# Patient Record
Sex: Female | Born: 1935 | Race: Black or African American | Hispanic: No | Smoking: Former smoker
Health system: Southern US, Community
[De-identification: ages and names within clinical notes are randomized; demographics above are authoritative.]

## PROBLEM LIST (undated history)

## (undated) DIAGNOSIS — B351 Tinea unguium: Secondary | ICD-10-CM

## (undated) DIAGNOSIS — J90 Pleural effusion, not elsewhere classified: Secondary | ICD-10-CM

## (undated) DIAGNOSIS — E119 Type 2 diabetes mellitus without complications: Secondary | ICD-10-CM

## (undated) DIAGNOSIS — J181 Lobar pneumonia, unspecified organism: Secondary | ICD-10-CM

## (undated) DIAGNOSIS — J9621 Acute and chronic respiratory failure with hypoxia: Secondary | ICD-10-CM

## (undated) DIAGNOSIS — I471 Supraventricular tachycardia, unspecified: Secondary | ICD-10-CM

## (undated) DIAGNOSIS — J40 Bronchitis, not specified as acute or chronic: Secondary | ICD-10-CM

## (undated) DIAGNOSIS — M1990 Unspecified osteoarthritis, unspecified site: Secondary | ICD-10-CM

## (undated) DIAGNOSIS — R911 Solitary pulmonary nodule: Secondary | ICD-10-CM

## (undated) DIAGNOSIS — Z853 Personal history of malignant neoplasm of breast: Secondary | ICD-10-CM

## (undated) DIAGNOSIS — I5023 Acute on chronic systolic (congestive) heart failure: Secondary | ICD-10-CM

## (undated) DIAGNOSIS — I10 Essential (primary) hypertension: Secondary | ICD-10-CM

## (undated) DIAGNOSIS — E785 Hyperlipidemia, unspecified: Secondary | ICD-10-CM

## (undated) DIAGNOSIS — J309 Allergic rhinitis, unspecified: Secondary | ICD-10-CM

## (undated) DIAGNOSIS — I340 Nonrheumatic mitral (valve) insufficiency: Secondary | ICD-10-CM

## (undated) HISTORY — PX: TOTAL ABDOMINAL HYSTERECTOMY: SHX209

## (undated) HISTORY — DX: Solitary pulmonary nodule: R91.1

## (undated) HISTORY — PX: BREAST CYST EXCISION: SHX579

## (undated) HISTORY — PX: BREAST SURGERY: SHX581

## (undated) HISTORY — DX: Nonrheumatic mitral (valve) insufficiency: I34.0

## (undated) HISTORY — DX: Hyperlipidemia, unspecified: E78.5

## (undated) HISTORY — DX: Personal history of malignant neoplasm of breast: Z85.3

## (undated) HISTORY — PX: INCISIONAL BREAST BIOPSY: SHX1812

## (undated) HISTORY — DX: Allergic rhinitis, unspecified: J30.9

## (undated) HISTORY — DX: Essential (primary) hypertension: I10

## (undated) HISTORY — DX: Type 2 diabetes mellitus without complications: E11.9

## (undated) HISTORY — DX: Unspecified osteoarthritis, unspecified site: M19.90

## (undated) HISTORY — DX: Bronchitis, not specified as acute or chronic: J40

## (undated) HISTORY — DX: Tinea unguium: B35.1

---

## 2004-10-25 ENCOUNTER — Ambulatory Visit: Payer: Self-pay | Admitting: Family Medicine

## 2004-12-15 ENCOUNTER — Ambulatory Visit: Payer: Self-pay | Admitting: Family Medicine

## 2005-01-30 ENCOUNTER — Ambulatory Visit: Payer: Self-pay | Admitting: Family Medicine

## 2005-04-26 ENCOUNTER — Ambulatory Visit: Payer: Self-pay | Admitting: Family Medicine

## 2005-06-08 ENCOUNTER — Ambulatory Visit: Payer: Self-pay | Admitting: Family Medicine

## 2005-10-31 ENCOUNTER — Ambulatory Visit: Payer: Self-pay | Admitting: Family Medicine

## 2005-12-19 ENCOUNTER — Ambulatory Visit: Payer: Self-pay | Admitting: Oncology

## 2005-12-20 ENCOUNTER — Ambulatory Visit: Payer: Self-pay | Admitting: Family Medicine

## 2005-12-25 ENCOUNTER — Ambulatory Visit: Admission: RE | Admit: 2005-12-25 | Discharge: 2006-02-28 | Payer: Self-pay | Admitting: *Deleted

## 2006-02-27 ENCOUNTER — Ambulatory Visit: Payer: Self-pay | Admitting: Oncology

## 2006-06-19 ENCOUNTER — Ambulatory Visit: Payer: Self-pay | Admitting: Oncology

## 2006-10-09 ENCOUNTER — Ambulatory Visit: Payer: Self-pay | Admitting: Oncology

## 2007-01-29 ENCOUNTER — Ambulatory Visit: Payer: Self-pay | Admitting: Oncology

## 2007-05-28 ENCOUNTER — Ambulatory Visit: Payer: Self-pay | Admitting: Oncology

## 2014-11-11 ENCOUNTER — Telehealth: Payer: Self-pay | Admitting: Critical Care Medicine

## 2014-11-11 NOTE — Telephone Encounter (Signed)
Will send to Dr Joya Gaskins per [place] is needing new patient consult in [place] for eval of pulm nodules per Dr Garlon Hatchet. Does not want to travel to [facility] or [place], states that she is older and does not want to drive further than she has to. Referral paperwork has been obtained by front staff and has been placed up front to be given to Dr Joya Gaskins. Please advise Dr Joya Gaskins on scheduling appt. Thanks.

## 2014-11-11 NOTE — Telephone Encounter (Signed)
Will see if pt can come in on Dec 15 at 3:30 for a 4 pm appt in [employer].

## 2014-11-12 NOTE — Telephone Encounter (Signed)
Called, spoke with pt -  Consult scheduled with Dr. Joya Gaskins in [place] office on Dec 15 at 4 pm with pt arriving at 3:30 pm.  Pt aware of office location and will bring all meds with her to appt.  She verbalized understanding and voiced no further questions or concerns at this time.

## 2014-11-16 ENCOUNTER — Encounter: Payer: Self-pay | Admitting: Critical Care Medicine

## 2014-11-17 ENCOUNTER — Encounter: Payer: Self-pay | Admitting: Critical Care Medicine

## 2014-11-17 ENCOUNTER — Ambulatory Visit (INDEPENDENT_AMBULATORY_CARE_PROVIDER_SITE_OTHER): Payer: Self-pay | Admitting: Critical Care Medicine

## 2014-11-17 VITALS — BP 114/56 | HR 75 | Temp 98.4°F | Ht 67.0 in | Wt 169.9 lb

## 2014-11-17 DIAGNOSIS — C3490 Malignant neoplasm of unspecified part of unspecified bronchus or lung: Secondary | ICD-10-CM | POA: Insufficient documentation

## 2014-11-17 DIAGNOSIS — J302 Other seasonal allergic rhinitis: Secondary | ICD-10-CM

## 2014-11-17 DIAGNOSIS — R918 Other nonspecific abnormal finding of lung field: Secondary | ICD-10-CM

## 2014-11-17 DIAGNOSIS — Z853 Personal history of malignant neoplasm of breast: Secondary | ICD-10-CM

## 2014-11-17 DIAGNOSIS — J40 Bronchitis, not specified as acute or chronic: Secondary | ICD-10-CM

## 2014-11-17 DIAGNOSIS — J309 Allergic rhinitis, unspecified: Secondary | ICD-10-CM | POA: Insufficient documentation

## 2014-11-17 DIAGNOSIS — I10 Essential (primary) hypertension: Secondary | ICD-10-CM | POA: Insufficient documentation

## 2014-11-17 DIAGNOSIS — J984 Other disorders of lung: Secondary | ICD-10-CM

## 2014-11-17 DIAGNOSIS — I340 Nonrheumatic mitral (valve) insufficiency: Secondary | ICD-10-CM | POA: Insufficient documentation

## 2014-11-17 DIAGNOSIS — E119 Type 2 diabetes mellitus without complications: Secondary | ICD-10-CM | POA: Insufficient documentation

## 2014-11-17 NOTE — Patient Instructions (Signed)
You need a bronchoscopy and later a PET scan.  Think this over and get back to [place] with your decision. It is ok to wait until January to proceed

## 2014-11-17 NOTE — Progress Notes (Addendum)
Subjective:    Patient ID: Judy Roberts, female    DOB: 01-15-1929, 80 y.o.   MRN: [id number]  HPI Comments: Referral for Left lower lobe lung mass.  Pt notes a chronic cough, hx of bronchitis 09/2014 and started.  Rx ABX, then mid 10/2014 noted more wheezing, more ABX Rx, Cxr done abn>>then CT Chest performed and mass in LLL.  Hx of abn on CXR.  Breast Ca with R breast lumpectomy, 2006.  LNs neg. XRT postop x 6 weeks.  Mammography has been unremarkable.   Cough This is a new problem. The current episode started more than 1 month ago. The problem has been unchanged. The problem occurs every few hours. The cough is productive of sputum. Associated symptoms include wheezing. Pertinent negatives include no chest pain, ear congestion, ear pain, fever, headaches, heartburn, hemoptysis, nasal congestion, postnasal drip, sore throat, shortness of breath or sweats. Nothing aggravates the symptoms. Treatments tried: abx helped  Her past medical history is significant for bronchitis. There is no history of asthma, bronchiectasis, COPD, emphysema, environmental allergies or pneumonia.    Past Medical History  Diagnosis Date  . Allergic rhinitis   . Benign essential hypertension   . Complicated bronchitis   . Diabetes mellitus with no complication   . Mitral regurgitation   . Onychomycosis   . Pulmonary nodule   . History of breast cancer      Family History  Problem Relation Age of Onset  . Pancreatic cancer Sister      History   Social History  . Marital Status: Married    Spouse Name: N/A    Number of Children: N/A  . Years of Education: N/A   Occupational History  . 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 Smoking status: Former Smoker -- 0.30 packs/day for 15 years    Types: Cigarettes    Quit date: 12/04/2004  . Smokeless tobacco: Never Used  . Alcohol Use: No  . Drug Use: No  . Sexual Activity: Not on file   Other Topics Concern  . Not on file     Social History Narrative     Allergies  Allergen Reactions  . Asa [Aspirin]     Gi upset  . Atorvastatin     GI upset  . Levofloxacin     dizziness     Outpatient Prescriptions Prior to Visit  Medication Sig Dispense Refill  . aspirin 81 MG tablet Take 81 mg by mouth daily.    Marland Kitchen ezetimibe (ZETIA) 10 MG tablet Take 10 mg by mouth daily.    . fluticasone (FLONASE) 50 MCG/ACT nasal spray Place 2 sprays into both nostrils daily.    Marland Kitchen lisinopril-hydrochlorothiazide (PRINZIDE,ZESTORETIC) 10-12.5 MG per tablet Take 1 tablet by mouth daily.    Marland Kitchen loratadine (CLARITIN) 10 MG tablet Take 10 mg by mouth daily.     No facility-administered medications prior to visit.      Review of Systems  Constitutional: Negative for fever, fatigue and unexpected weight change.  HENT: Negative for ear pain, postnasal drip, sore throat and trouble swallowing.   Respiratory: Positive for cough and wheezing. Negative for hemoptysis, choking, chest tightness and shortness of breath.   Cardiovascular: Negative for chest pain.  Gastrointestinal: Negative.  Negative for heartburn.  Allergic/Immunologic: Negative for environmental allergies.  Neurological: Negative for headaches.       Objective:   Physical Exam Filed Vitals:   11/17/14 1636  BP:  114/56  Pulse: 75  Temp: 98.4 F (36.9 C)  TempSrc: Oral  Height: 5\' 7"  (1.702 m)  Weight: 169 lb 14.4 oz (77.066 kg)  SpO2: 98%    Gen: Pleasant, well-nourished, in no distress,  normal affect  ENT: No lesions,  mouth clear,  oropharynx clear, no postnasal drip  Neck: No JVD, no TMG, no carotid bruits  Lungs: No use of accessory muscles, no dullness to percussion, clear without rales or rhonchi  Cardiovascular: RRR, heart sounds normal, no murmur or gallops, no peripheral edema  Abdomen: soft and NT, no HSM,  BS normal  Musculoskeletal: No deformities, no cyanosis or clubbing  Neuro: alert, non focal  Skin: Warm, no lesions or  rashes  No results found.        Assessment & Plan:   Mass of lower lobe of left lung Ill-defined left mid lung zone 3.3 x 2.4 cm lung mass.  Note a smaller ill-defined lesion was seen in the left midlung zone in 2011 on previous CT scan of chest however no follow-up scan had been obtained except for chest x-rays which did not reveal significant change until current CT scan of chest November 2015 was obtained Concern with previous history of breast cancer this could be recurrence or could be a primary lung malignancy And airway appears to leads straight to the mass so I believe bronchoscopy would be the best course of diagnostic with The patient is declining evaluation at this time Plan The patient will give further consideration and get back to this office with regards to her decision on evaluation    Updated Medication List Outpatient Encounter Prescriptions as of 11/17/2014  Medication Sig  . aspirin 81 MG tablet Take 81 mg by mouth daily.  Marland Kitchen ezetimibe (ZETIA) 10 MG tablet Take 10 mg by mouth daily.  . fluticasone (FLONASE) 50 MCG/ACT nasal spray Place 2 sprays into both nostrils daily.  Marland Kitchen lisinopril-hydrochlorothiazide (PRINZIDE,ZESTORETIC) 10-12.5 MG per tablet Take 1 tablet by mouth daily.  Marland Kitchen loratadine (CLARITIN) 10 MG tablet Take 10 mg by mouth daily.  . naproxen sodium (ANAPROX) 220 MG tablet Take 220 mg by mouth as needed.

## 2014-11-18 NOTE — Assessment & Plan Note (Addendum)
Ill-defined left mid lung zone 3.3 x 2.4 cm lung mass.  Note a smaller ill-defined lesion was seen in the left midlung zone in 2011 on previous CT scan of chest however no follow-up scan had been obtained except for chest x-rays which did not reveal significant change until current CT scan of chest November 2015 was obtained Concern with previous history of breast cancer this could be recurrence or could be a primary lung malignancy And airway appears to leads straight to the mass so I believe bronchoscopy would be the best course of diagnostic with The patient is declining evaluation at this time Plan The patient will give further consideration and get back to this office with regards to her decision on evaluation

## 2014-11-30 ENCOUNTER — Telehealth: Payer: Self-pay | Admitting: Critical Care Medicine

## 2014-11-30 NOTE — Telephone Encounter (Signed)
Called spoke with patient who reported that she would like to move forward with having a bronch and wonders if Jan 11th or 12th would be possible.   Pt is aware that PW is not in the office until 12/30 and is okay with waiting until he returns to the office  Dr Joya Gaskins please advise, thank you.

## 2014-12-02 NOTE — Telephone Encounter (Signed)
Spoke with Pamala Hurry with Respiratory.  Bronch scheduled for Dec 14, 2014 at 8 am at [facility] with fluoro. Pt to arrive to admitting at 6:45 am; NPO after midnight.  Pt will need driver.   Called, spoke with pt - pt aware of above and voiced no further questions or concerns at this time. Will sign off and route to PW as fyi.

## 2014-12-02 NOTE — Telephone Encounter (Signed)
Please schedule FOB at [facility] for 8AM 12/14/13   Dx lung mass LLL,  Need fluoro.  Let me know when this is done

## 2014-12-03 ENCOUNTER — Other Ambulatory Visit: Payer: Self-pay | Admitting: Critical Care Medicine

## 2014-12-03 DIAGNOSIS — R918 Other nonspecific abnormal finding of lung field: Secondary | ICD-10-CM

## 2014-12-03 NOTE — Telephone Encounter (Signed)
noted 

## 2014-12-14 ENCOUNTER — Encounter (HOSPITAL_COMMUNITY): Payer: Self-pay | Admitting: Radiology

## 2014-12-14 ENCOUNTER — Encounter (HOSPITAL_COMMUNITY)
Admission: RE | Disposition: A | Discharge status: Home / Self Care | Payer: Self-pay | Source: Ambulatory Visit | Attending: Critical Care Medicine

## 2014-12-14 ENCOUNTER — Ambulatory Visit (HOSPITAL_COMMUNITY): Payer: Medicare Other

## 2014-12-14 ENCOUNTER — Ambulatory Visit (HOSPITAL_COMMUNITY)
Admission: RE | Admit: 2014-12-14 | Discharge: 2014-12-14 | Disposition: A | Discharge status: Home / Self Care | Payer: Medicare Other | Source: Ambulatory Visit | Attending: Critical Care Medicine | Admitting: Critical Care Medicine

## 2014-12-14 DIAGNOSIS — I10 Essential (primary) hypertension: Secondary | ICD-10-CM | POA: Insufficient documentation

## 2014-12-14 DIAGNOSIS — Z87891 Personal history of nicotine dependence: Secondary | ICD-10-CM | POA: Insufficient documentation

## 2014-12-14 DIAGNOSIS — J181 Lobar pneumonia, unspecified organism: Secondary | ICD-10-CM

## 2014-12-14 DIAGNOSIS — Z79899 Other long term (current) drug therapy: Secondary | ICD-10-CM | POA: Diagnosis not present

## 2014-12-14 DIAGNOSIS — E119 Type 2 diabetes mellitus without complications: Secondary | ICD-10-CM | POA: Diagnosis not present

## 2014-12-14 DIAGNOSIS — Z7982 Long term (current) use of aspirin: Secondary | ICD-10-CM | POA: Diagnosis not present

## 2014-12-14 DIAGNOSIS — R918 Other nonspecific abnormal finding of lung field: Secondary | ICD-10-CM | POA: Insufficient documentation

## 2014-12-14 DIAGNOSIS — Z853 Personal history of malignant neoplasm of breast: Secondary | ICD-10-CM | POA: Insufficient documentation

## 2014-12-14 DIAGNOSIS — R05 Cough: Secondary | ICD-10-CM | POA: Insufficient documentation

## 2014-12-14 DIAGNOSIS — J189 Pneumonia, unspecified organism: Secondary | ICD-10-CM

## 2014-12-14 HISTORY — PX: VIDEO BRONCHOSCOPY: SHX5072

## 2014-12-14 SURGERY — BRONCHOSCOPY, WITH FLUOROSCOPY
Anesthesia: Moderate Sedation | Laterality: Bilateral

## 2014-12-14 MED ORDER — SODIUM CHLORIDE 0.9 % IV SOLN: Freq: Once | INTRAVENOUS | Status: DC

## 2014-12-14 MED ORDER — PHENYLEPHRINE HCL 0.25 % NA SOLN
1.0000 | Freq: Four times a day (QID) | NASAL | Status: DC | PRN
Start: 2014-12-14 — End: 2014-12-15

## 2014-12-14 MED ORDER — MIDAZOLAM HCL 5 MG/ML IJ SOLN
INTRAMUSCULAR | Status: AC
  Filled 2014-12-14: qty 2

## 2014-12-14 MED ORDER — MIDAZOLAM HCL 10 MG/2ML IJ SOLN
INTRAMUSCULAR | Status: DC | PRN
  Administered 2014-12-14: 3 mg via INTRAVENOUS
  Administered 2014-12-14: 1 mg via INTRAVENOUS

## 2014-12-14 MED ORDER — FENTANYL CITRATE 0.05 MG/ML IJ SOLN
INTRAMUSCULAR | Status: DC | PRN
  Administered 2014-12-14: 30 ug via INTRAVENOUS

## 2014-12-14 MED ORDER — LIDOCAINE HCL 2 % EX GEL
CUTANEOUS | Status: DC | PRN
  Administered 2014-12-14: 1

## 2014-12-14 MED ORDER — PHENYLEPHRINE HCL 0.25 % NA SOLN
NASAL | Status: DC | PRN
  Administered 2014-12-14: 2 via NASAL

## 2014-12-14 MED ORDER — SODIUM CHLORIDE 0.9 % IV SOLN
INTRAVENOUS | Status: DC
  Administered 2014-12-14: 07:00:00 via INTRAVENOUS

## 2014-12-14 MED ORDER — FENTANYL CITRATE 0.05 MG/ML IJ SOLN
INTRAMUSCULAR | Status: AC
  Filled 2014-12-14: qty 4

## 2014-12-14 MED ORDER — LIDOCAINE HCL 1 % IJ SOLN
INTRAMUSCULAR | Status: DC | PRN
  Administered 2014-12-14: 6 mg via RESPIRATORY_TRACT

## 2014-12-14 MED ORDER — BUTAMBEN-TETRACAINE-BENZOCAINE 2-2-14 % EX AERO: 1.0000 | INHALATION_SPRAY | Freq: Once | CUTANEOUS | Status: DC

## 2014-12-14 MED ORDER — LIDOCAINE HCL 2 % EX GEL: Freq: Once | CUTANEOUS | Status: DC

## 2014-12-14 NOTE — Interval H&P Note (Signed)
Pt seen and examined. There have been no interval changes in H and P.  The pt is ready for planned FOB and Bx. Saralyn Pilar WrightMD

## 2014-12-14 NOTE — H&P (View-Only) (Signed)
Subjective:    Patient ID: Judy Roberts, female    DOB: 1929-07-06, 81 y.o.   MRN: [id number]  HPI Comments: Referral for Left lower lobe lung mass.  Pt notes a chronic cough, hx of bronchitis 09/2014 and started.  Rx ABX, then mid 10/2014 noted more wheezing, more ABX Rx, Cxr done abn>>then CT Chest performed and mass in LLL.  Hx of abn on CXR.  Breast Ca with R breast lumpectomy, 2006.  LNs neg. XRT postop x 6 weeks.  Mammography has been unremarkable.   Cough This is a new problem. The current episode started more than 1 month ago. The problem has been unchanged. The problem occurs every few hours. The cough is productive of sputum. Associated symptoms include wheezing. Pertinent negatives include no chest pain, ear congestion, ear pain, fever, headaches, heartburn, hemoptysis, nasal congestion, postnasal drip, sore throat, shortness of breath or sweats. Nothing aggravates the symptoms. Treatments tried: abx helped  Her past medical history is significant for bronchitis. There is no history of asthma, bronchiectasis, COPD, emphysema, environmental allergies or pneumonia.    Past Medical History  Diagnosis Date  . Allergic rhinitis   . Benign essential hypertension   . Complicated bronchitis   . Diabetes mellitus with no complication   . Mitral regurgitation   . Onychomycosis   . Pulmonary nodule   . History of breast cancer      Family History  Problem Relation Age of Onset  . Pancreatic cancer Sister      History   Social History  . Marital Status: Married    Spouse Name: N/A    Number of Children: N/A  . Years of Education: N/A   Occupational History  . 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 Smoking status: Former Smoker -- 0.30 packs/day for 15 years    Types: Cigarettes    Quit date: 12/04/2004  . Smokeless tobacco: Never Used  . Alcohol Use: No  . Drug Use: No  . Sexual Activity: Not on file   Other Topics Concern  . Not on file     Social History Narrative     Allergies  Allergen Reactions  . Asa [Aspirin]     Gi upset  . Atorvastatin     GI upset  . Levofloxacin     dizziness     Outpatient Prescriptions Prior to Visit  Medication Sig Dispense Refill  . aspirin 81 MG tablet Take 81 mg by mouth daily.    Marland Kitchen ezetimibe (ZETIA) 10 MG tablet Take 10 mg by mouth daily.    . fluticasone (FLONASE) 50 MCG/ACT nasal spray Place 2 sprays into both nostrils daily.    Marland Kitchen lisinopril-hydrochlorothiazide (PRINZIDE,ZESTORETIC) 10-12.5 MG per tablet Take 1 tablet by mouth daily.    Marland Kitchen loratadine (CLARITIN) 10 MG tablet Take 10 mg by mouth daily.     No facility-administered medications prior to visit.      Review of Systems  Constitutional: Negative for fever, fatigue and unexpected weight change.  HENT: Negative for ear pain, postnasal drip, sore throat and trouble swallowing.   Respiratory: Positive for cough and wheezing. Negative for hemoptysis, choking, chest tightness and shortness of breath.   Cardiovascular: Negative for chest pain.  Gastrointestinal: Negative.  Negative for heartburn.  Allergic/Immunologic: Negative for environmental allergies.  Neurological: Negative for headaches.       Objective:   Physical Exam Filed Vitals:   11/17/14 1636  BP:  114/56  Pulse: 75  Temp: 98.4 F (36.9 C)  TempSrc: Oral  Height: 5\' 7"  (1.702 m)  Weight: 169 lb 14.4 oz (77.066 kg)  SpO2: 98%    Gen: Pleasant, well-nourished, in no distress,  normal affect  ENT: No lesions,  mouth clear,  oropharynx clear, no postnasal drip  Neck: No JVD, no TMG, no carotid bruits  Lungs: No use of accessory muscles, no dullness to percussion, clear without rales or rhonchi  Cardiovascular: RRR, heart sounds normal, no murmur or gallops, no peripheral edema  Abdomen: soft and NT, no HSM,  BS normal  Musculoskeletal: No deformities, no cyanosis or clubbing  Neuro: alert, non focal  Skin: Warm, no lesions or  rashes  No results found.        Assessment & Plan:   Mass of lower lobe of left lung Ill-defined left mid lung zone 3.3 x 2.4 cm lung mass.  Note a smaller ill-defined lesion was seen in the left midlung zone in 2011 on previous CT scan of chest however no follow-up scan had been obtained except for chest x-rays which did not reveal significant change until current CT scan of chest November 2015 was obtained Concern with previous history of breast cancer this could be recurrence or could be a primary lung malignancy And airway appears to leads straight to the mass so I believe bronchoscopy would be the best course of diagnostic with The patient is declining evaluation at this time Plan The patient will give further consideration and get back to this office with regards to her decision on evaluation    Updated Medication List Outpatient Encounter Prescriptions as of 11/17/2014  Medication Sig  . aspirin 81 MG tablet Take 81 mg by mouth daily.  Marland Kitchen ezetimibe (ZETIA) 10 MG tablet Take 10 mg by mouth daily.  . fluticasone (FLONASE) 50 MCG/ACT nasal spray Place 2 sprays into both nostrils daily.  Marland Kitchen lisinopril-hydrochlorothiazide (PRINZIDE,ZESTORETIC) 10-12.5 MG per tablet Take 1 tablet by mouth daily.  Marland Kitchen loratadine (CLARITIN) 10 MG tablet Take 10 mg by mouth daily.  . naproxen sodium (ANAPROX) 220 MG tablet Take 220 mg by mouth as needed.

## 2014-12-14 NOTE — Discharge Instructions (Signed)
Flexible Bronchoscopy, Care After These instructions give you information on caring for yourself after your procedure. Your doctor may also give you more specific instructions. Call your doctor if you have any problems or questions after your procedure. HOME CARE  Do not eat or drink anything for 2 hours after your procedure. If you try to eat or drink before the medicine wears off, food or drink could go into your lungs. You could also burn yourself.  After 2 hours have passed and when you can cough and gag normally, you may eat soft food and drink liquids slowly.  The day after the test, you may eat your normal diet.  You may do your normal activities.  Keep all doctor visits. GET HELP RIGHT AWAY IF:  You get more and more short of breath.  You get light-headed.  You feel like you are going to pass out (faint).  You have chest pain.  You have new problems that worry you.  You cough up more than a little blood.  You cough up more blood than before. MAKE SURE YOU:  Understand these instructions.  Will watch your condition.  Will get help right away if you are not doing well or get worse. Document Released: 09/17/2009 Document Revised: 11/25/2013 Document Reviewed: 07/25/2013 [facility] Patient Information 2015 [place], [place]. This information is not intended to replace advice given to you by your health care provider. Make sure you discuss any questions you have with your health care provider.

## 2014-12-14 NOTE — Op Note (Signed)
Bronchoscopy Procedure Note  Date of Operation: 12/14/2014  Pre-op Diagnosis: LLL Lung mass   Post-op Diagnosis: same   Surgeon: Asencion Noble  Anesthesia: Monitored Local Anesthesia with Sedation:    Versed 4 mg IVP;  Fentanyl 30 mcg IVP  Operation: Flexible fiberoptic bronchoscopy, diagnostic   Findings: No endobronchial lesions  Specimen: TBBx LLL, Bronchial washings  Estimated Blood Loss: Minimal  Complications: None  Indications and History: The patient is a 81 y.o. female with LLL lung mass, hx of Breast Ca.  The risks, benefits, complications, treatment options and expected outcomes were discussed with the patient.  The possibilities of reaction to medication, pulmonary aspiration, perforation of a viscus, bleeding, failure to diagnose a condition and creating a complication requiring transfusion or operation were discussed with the patient who freely signed the consent.    Description of Procedure: The patient was re-examined in the bronchoscopy suite and the site of surgery properly noted/marked.  The patient was identified as Judy Roberts and the procedure verified as Flexible Fiberoptic Bronchoscopy.  A Time Out was held and the above information confirmed.   After the induction of topical nasopharyngeal anesthesia, the patient was positioned  and the bronchoscope was passed through the R  nares. The vocal cords were visualized and  1% buffered lidocaine 5 ml was topically placed onto the cords. The cords were normal. The scope was then passed into the trachea.  1% buffered lidocaine 5 ml was used topically on the carina.  Careful inspection of the tracheal lumen was accomplished. The scope was sequentially passed into the left main and then left upper and lower bronchi and segmental bronchi.     LLL TBBx X 6 and Bronchial washings  Were  done and there was 2 specimen.  Note needed fluoroscopic control to perform FOB TBBX, NO endobronchial lesions seen   The scope was then  withdrawn and advanced into the right main bronchus and then into the RUL, RML, and RLL bronchi and segmental bronchi.    Endobronchial findings: None Trachea: Normal mucosa Carina: Normal mucosa Right main bronchus: Normal mucosa Right upper lobe bronchus: Normal mucosa Right middle lobe bronchus: Normal mucosa Right lower lobe bronchus: Normal mucosa Left main bronchus: Normal mucosa Left upper lobe bronchus: Normal mucosa Left lower lobe bronchus: Normal mucosa  The Patient was taken to the Endoscopy Recovery area in satisfactory condition.  Attestation: I performed the procedure.  Saralyn Pilar WrightMD 12/14/2014 8:25 AM

## 2014-12-14 NOTE — Progress Notes (Signed)
Video bronchoscopy with biospy intervention, washing intervention, pt. Did well thru out procedure, no complications. Gave report to endo nurse.  I agree with the above note Asencion Noble

## 2014-12-15 ENCOUNTER — Encounter (HOSPITAL_COMMUNITY): Payer: Self-pay | Admitting: Critical Care Medicine

## 2014-12-16 ENCOUNTER — Encounter: Payer: Self-pay | Admitting: Critical Care Medicine

## 2014-12-16 ENCOUNTER — Telehealth: Payer: Self-pay | Admitting: Critical Care Medicine

## 2014-12-16 DIAGNOSIS — C3492 Malignant neoplasm of unspecified part of left bronchus or lung: Secondary | ICD-10-CM

## 2014-12-16 LAB — CULTURE, RESPIRATORY

## 2014-12-16 LAB — CULTURE, RESPIRATORY W GRAM STAIN

## 2014-12-16 NOTE — Telephone Encounter (Signed)
Result Note     Pt aware of results of Lung cancer: squamous cell CA in biopsy.     Pt needs referral to [facility] Dr Hinton Rao or Bobby Rumpf    Also needs PET scan and PFTs at [facility]     ---------  Orders placed - pt aware she will receive a call with appt dates, times, and locations.  She verbalized understanding and voiced no further questions or concerns at this time.  PCCs, will you please ensure PET and PFTs are scheduled at [facility].  Orders placed along with referral to [facility].  Thank you.

## 2014-12-16 NOTE — Progress Notes (Signed)
Quick Note:  Orders placed -- see phone msg from 12/16/14. ______

## 2014-12-17 ENCOUNTER — Telehealth: Payer: Self-pay | Admitting: Critical Care Medicine

## 2014-12-17 NOTE — Telephone Encounter (Signed)
Pet and [email or web address] 12/17/14 and referral has been made to [facility] Joellen Jersey

## 2014-12-17 NOTE — Telephone Encounter (Signed)
Called spoke with pt. She wanted to ensure we did place the referral to [facility]. Made her aware of phone note 12/16/14 that this was done so. Nothing further needed

## 2014-12-29 LAB — PULMONARY FUNCTION TEST

## 2014-12-30 ENCOUNTER — Telehealth: Payer: Self-pay | Admitting: Critical Care Medicine

## 2014-12-30 DIAGNOSIS — C3492 Malignant neoplasm of unspecified part of left bronchus or lung: Secondary | ICD-10-CM

## 2014-12-30 NOTE — Telephone Encounter (Signed)
Call pt and tell her CT Head Neg for mets PET scan:  Pos for Cancer on right side but also right sided lower abdominal muscle area that may be spread of cancer, is she having any pain in the Right lower abdomen or back?  Fax results to Dr Hinton Rao in [place]

## 2014-12-31 NOTE — Telephone Encounter (Signed)
Spoke with pt.  Discussed below per Dr. Joya Gaskins.  Pt states she was seen by Dr. Bobby Rumpf yesterday who also discussed results with her.  Pt reports she believes Dr. Bobby Rumpf wants her to have another scan but his office is supposed to be contacting her back about this.  Pt reports she does not have abd pain but does have a dull aching back in her lower back daily.  She takes Tylenol for this which helps.    Results faxed to Dr. Lewis/McCarty's office along with phone msg.  Pt aware and voiced no further questions or concerns at this time.  Will route to PW as FYI.

## 2015-01-04 NOTE — Telephone Encounter (Signed)
noted 

## 2015-01-05 ENCOUNTER — Telehealth: Payer: Self-pay | Admitting: Critical Care Medicine

## 2015-01-05 ENCOUNTER — Encounter: Payer: Medicare Other | Admitting: Cardiothoracic Surgery

## 2015-01-05 DIAGNOSIS — C3492 Malignant neoplasm of unspecified part of left bronchus or lung: Secondary | ICD-10-CM

## 2015-01-05 NOTE — Telephone Encounter (Signed)
Let pt know pulmonary functions were stable No severe obstruction Fax copy to dr Hinton Rao at cancer center

## 2015-01-06 NOTE — Telephone Encounter (Signed)
Spoke with pt regarding PFT results.  She verbalized understanding and is aware I will fax results [place].  She voiced no further questions or concerns at this time.  Results faxed to [facility] and placed in scan folder.

## 2015-01-11 LAB — FUNGUS CULTURE W SMEAR: FUNGAL SMEAR: NONE SEEN

## 2015-01-12 ENCOUNTER — Encounter: Payer: Self-pay | Admitting: *Deleted

## 2015-01-12 ENCOUNTER — Encounter: Payer: Medicare Other | Admitting: Cardiothoracic Surgery

## 2015-01-12 ENCOUNTER — Ambulatory Visit (INDEPENDENT_AMBULATORY_CARE_PROVIDER_SITE_OTHER): Payer: Medicare Other | Admitting: Cardiothoracic Surgery

## 2015-01-12 ENCOUNTER — Other Ambulatory Visit: Payer: Self-pay | Admitting: *Deleted

## 2015-01-12 ENCOUNTER — Encounter: Payer: Self-pay | Admitting: Cardiothoracic Surgery

## 2015-01-12 VITALS — BP 134/72 | HR 80 | Resp 16 | Ht 67.0 in | Wt 170.0 lb

## 2015-01-12 DIAGNOSIS — C3412 Malignant neoplasm of upper lobe, left bronchus or lung: Secondary | ICD-10-CM

## 2015-01-12 DIAGNOSIS — E785 Hyperlipidemia, unspecified: Secondary | ICD-10-CM | POA: Insufficient documentation

## 2015-01-12 DIAGNOSIS — M1990 Unspecified osteoarthritis, unspecified site: Secondary | ICD-10-CM | POA: Insufficient documentation

## 2015-01-12 DIAGNOSIS — R599 Enlarged lymph nodes, unspecified: Secondary | ICD-10-CM

## 2015-01-12 DIAGNOSIS — C3490 Malignant neoplasm of unspecified part of unspecified bronchus or lung: Secondary | ICD-10-CM

## 2015-01-12 DIAGNOSIS — R590 Localized enlarged lymph nodes: Secondary | ICD-10-CM

## 2015-01-12 NOTE — Progress Notes (Signed)
[address]       [place] [address]             [phone]                    [name] Mulga Medical Record #[id number] Date of Birth: Sep 21, 1929  Referring: Marice Potter, MD Primary Care: Teressa Lower, MD  Chief Complaint:    Chief Complaint  Patient presents with  . Lung Cancer    eval for bx...PET, PFT'S    History of Present Illness:    Judy Roberts 81 y.o. female is seen in the office  today at the request of Dr Bobby Rumpf. Patent   Has history of breast cancer  Treated with lumpectomy and radiation in 2006 She  Had episode of bronchitis in Nov 2015. Chest xray showed a lung mass, ct and pet scan done. 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  Patient referred for elauation of rt mediastinal hilar nodes on PET in setting of left lungmass.  Current Activity/ Functional Status:  Patient is independent with mobility/ambulation, transfers, ADL's, IADL's.   Zubrod Score: At the time of surgery this patient's most appropriate activity status/level should be described as: []     0    Normal activity, no symptoms [x]     1    Restricted in physical strenuous activity but ambulatory, able to do out light work []     2    Ambulatory and capable of self care, unable to do work activities, up and about               >50 % of waking hours                              []     3    Only limited self care, in bed greater than 50% of waking hours []     4    Completely disabled, no self care, confined to bed or chair []     5    Moribund   Past Medical History  Diagnosis Date  . Allergic rhinitis   . Benign essential hypertension   . Complicated bronchitis   . Diabetes mellitus with no complication   . Mitral regurgitation   . Onychomycosis   . Pulmonary nodule   . History of breast cancer   . Hyperlipidemia   . Arthritis     OSTEOARTHRITIS    Past Surgical History  Procedure Laterality Date  . Incisional breast  biopsy    . Total abdominal hysterectomy    . Video bronchoscopy Bilateral 12/14/2014    Procedure: VIDEO BRONCHOSCOPY WITH FLUORO;  Surgeon: Elsie Stain, MD;  Location: [facility];  Service: Cardiopulmonary;  Laterality: Bilateral;  . Breast surgery Right     LUMPECTOMY  . Breast cyst excision Left     Family History  Problem Relation Age of Onset  . Pancreatic cancer Sister   . Diabetes Mother   . Alzheimer's disease Mother   . Stroke Father   . Hypertension Father   . Other Brother     MVA  . Drug abuse Brother     History   Social History  . Marital Status: Widowed    Spouse Name: N/A  . Number of Children: N/A  . Years of Education: N/A   Occupational History  . 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 Smoking status: Former Smoker -- 0.30 packs/day for 45 years    Types: Cigarettes    Quit date: 12/04/2004  . Smokeless tobacco: Never Used  . Alcohol Use: No  . Drug Use: No  . Sexual Activity: Not on file   Other Topics Concern  . Not on file   Social History Narrative    History  Smoking status  . Former Smoker -- 0.30 packs/day for 45 years  . Types: Cigarettes  . Quit date: 12/04/2004  Smokeless tobacco  . Never Used    History  Alcohol Use No     Allergies  Allergen Reactions  . Asa [Aspirin]     Gi upset  . Atorvastatin     GI upset  . Levofloxacin     dizziness    Current Outpatient Prescriptions  Medication Sig Dispense Refill  . aspirin 81 MG tablet Take 81 mg by mouth daily.    Marland Kitchen ezetimibe (ZETIA) 10 MG tablet Take 10 mg by mouth daily.    . fluticasone (FLONASE) 50 MCG/ACT nasal spray Place 2 sprays into both nostrils daily.    Marland Kitchen lisinopril (PRINIVIL,ZESTRIL) 10 MG tablet Take 10 mg by mouth daily.    Marland Kitchen loratadine (CLARITIN) 10 MG tablet Take 10 mg by mouth daily.    . naproxen sodium (ANAPROX) 220 MG tablet Take 220 mg by mouth daily as needed (pain).      No current facility-administered  medications for this visit.      Review of Systems:     Cardiac Review of Systems: Y or N  Chest Pain [  n  ]  Resting SOB [  n ] Exertional SOB  Blue.Reese  ]  Orthopnea [ n ]   Pedal Edema [   ]    Palpitations [  ] Syncope  [  ]   Presyncope [   ]  General Review of Systems: [Y] = yes [  ]=no Constitional: recent weight change [n  ];  Wt loss over the last 3 months [   ] anorexia [  ]; fatigue [  ]; nausea [  ]; night sweats [  ]; fever [  ]; or chills [  ];          Dental: poor dentition[  ]; Last Dentist visit:   Eye : blurred vision [  ]; diplopia [   ]; vision changes [  ];  Amaurosis fugax[  ]; Resp: cough [  ];  wheezing[  ];  hemoptysis[  ]; shortness of breath[  ]; paroxysmal nocturnal dyspnea[  ]; dyspnea on exertion[  ]; or orthopnea[  ];  GI:  gallstones[  ], vomiting[  ];  dysphagia[  ]; melena[  ];  hematochezia [  ]; heartburn[  ];   Hx of  Colonoscopy[  ]; GU: kidney stones [  ]; hematuria[  ];   dysuria [  ];  nocturia[  ];  history of     obstruction [  ]; urinary frequency [  ]             Skin: rash, swelling[  ];, hair loss[  ];  peripheral edema[  ];  or itching[  ]; Musculosketetal: myalgias[  ];  joint swelling[  ];  joint erythema[  ];  joint pain[  ];  back pain[  ];  Heme/Lymph: bruising[  ];  bleeding[  ];  anemia[  ];  Neuro: TIA[  ];  headaches[  ];  stroke[  ];  vertigo[  ];  seizures[  ];   paresthesias[  ];  difficulty walking[  ];  Psych:depression[  ]; anxiety[  ];  Endocrine: diabetes[  ];  thyroid dysfunction[  ];  Immunizations: Flu up to date [  ]; Pneumococcal up to date [  ];  Other:  Physical Exam: BP 134/72 mmHg  Pulse 80  Resp 16  Ht 5\' 7"  (1.702 m)  Wt 170 lb (77.111 kg)  BMI 26.62 kg/m2  SpO2 98%  PHYSICAL EXAMINATION: General appearance: alert, cooperative, appears stated age and no distress Head: Normocephalic, without obvious abnormality, atraumatic Neck: no adenopathy, no carotid bruit, no JVD, supple, symmetrical, trachea midline and  thyroid not enlarged, symmetric, no tenderness/mass/nodules Lymph nodes: Cervical, supraclavicular, and axillary nodes normal. Resp: clear to auscultation bilaterally Back: symmetric, no curvature. ROM normal. No CVA tenderness. Cardio: regular rate and rhythm, S1, S2 normal, no murmur, click, rub or gallop and normal apical impulse GI: soft, non-tender; bowel sounds normal; no masses,  no organomegaly Extremities: extremities normal, atraumatic, no cyanosis or edema and Homans sign is negative, no sign of DVT Neurologic: Alert and oriented X 3, normal strength and tone. Normal symmetric reflexes. Normal coordination and gait  Diagnostic Studies & Laboratory data:     Recent Radiology Findings:  PET scan and Ct reviewed / done at [facility] Left lung mass and hypermetabolic right hilar nodes  I have independently reviewed the above radiologic studies.  Recent Lab Findings: No results found for: WBC, HGB, HCT, PLT, GLUCOSE, CHOL, TRIG, HDL, LDLDIRECT, LDLCALC, ALT, AST, NA, K, CL, CREATININE, BUN, CO2, TSH, INR, GLUF, HGBA1C    Assessment / Plan:   I have recommended to patient to proceed with EBUS and bx of mediastinal nodes to better stage known malignancy. Risk and options discussed patient is agreeable. Will Plan for Feb16.       I  spent 40 minutes counseling the patient face to face and 50% or more the  time was spent in counseling and coordination of care. The total time spent in the appointment was 60 minutes.  Grace Isaac MD      [address] [place] [address] Office [phone]   Beeper [phone]  01/15/2015 4:25 PM

## 2015-01-18 ENCOUNTER — Encounter (HOSPITAL_COMMUNITY): Payer: Self-pay | Admitting: *Deleted

## 2015-01-18 NOTE — Progress Notes (Signed)
Pt denies SOB, chest pain, and being under the care of a cardiologist. Pt denies having an EKG within the lat year and a CXR within the last 2 days. Pt denies having a stress test, echo, and cardiac cath. Spoke with pt niece as requested by pt to make aware of arrival time and location.

## 2015-01-19 ENCOUNTER — Ambulatory Visit (HOSPITAL_COMMUNITY): Payer: Medicare Other | Admitting: Anesthesiology

## 2015-01-19 ENCOUNTER — Encounter (HOSPITAL_COMMUNITY): Payer: Self-pay | Admitting: Surgery

## 2015-01-19 ENCOUNTER — Ambulatory Visit (HOSPITAL_COMMUNITY)
Admission: RE | Admit: 2015-01-19 | Discharge: 2015-01-19 | Disposition: A | Discharge status: Home / Self Care | Payer: Medicare Other | Source: Ambulatory Visit | Attending: Cardiothoracic Surgery | Admitting: Cardiothoracic Surgery

## 2015-01-19 ENCOUNTER — Ambulatory Visit (HOSPITAL_COMMUNITY): Payer: Medicare Other

## 2015-01-19 ENCOUNTER — Encounter (HOSPITAL_COMMUNITY)
Admission: RE | Disposition: A | Discharge status: Home / Self Care | Payer: Self-pay | Source: Ambulatory Visit | Attending: Cardiothoracic Surgery

## 2015-01-19 DIAGNOSIS — E785 Hyperlipidemia, unspecified: Secondary | ICD-10-CM | POA: Insufficient documentation

## 2015-01-19 DIAGNOSIS — Z853 Personal history of malignant neoplasm of breast: Secondary | ICD-10-CM | POA: Insufficient documentation

## 2015-01-19 DIAGNOSIS — Z9011 Acquired absence of right breast and nipple: Secondary | ICD-10-CM | POA: Insufficient documentation

## 2015-01-19 DIAGNOSIS — I10 Essential (primary) hypertension: Secondary | ICD-10-CM | POA: Diagnosis not present

## 2015-01-19 DIAGNOSIS — Z79899 Other long term (current) drug therapy: Secondary | ICD-10-CM | POA: Insufficient documentation

## 2015-01-19 DIAGNOSIS — E119 Type 2 diabetes mellitus without complications: Secondary | ICD-10-CM | POA: Diagnosis not present

## 2015-01-19 DIAGNOSIS — C383 Malignant neoplasm of mediastinum, part unspecified: Secondary | ICD-10-CM | POA: Diagnosis not present

## 2015-01-19 DIAGNOSIS — Z87891 Personal history of nicotine dependence: Secondary | ICD-10-CM | POA: Insufficient documentation

## 2015-01-19 DIAGNOSIS — C3412 Malignant neoplasm of upper lobe, left bronchus or lung: Secondary | ICD-10-CM

## 2015-01-19 DIAGNOSIS — C3490 Malignant neoplasm of unspecified part of unspecified bronchus or lung: Secondary | ICD-10-CM

## 2015-01-19 DIAGNOSIS — M1990 Unspecified osteoarthritis, unspecified site: Secondary | ICD-10-CM | POA: Diagnosis not present

## 2015-01-19 DIAGNOSIS — Z923 Personal history of irradiation: Secondary | ICD-10-CM | POA: Diagnosis not present

## 2015-01-19 DIAGNOSIS — C3432 Malignant neoplasm of lower lobe, left bronchus or lung: Secondary | ICD-10-CM | POA: Insufficient documentation

## 2015-01-19 HISTORY — PX: LUNG BIOPSY: SHX5088

## 2015-01-19 HISTORY — PX: VIDEO BRONCHOSCOPY WITH ENDOBRONCHIAL ULTRASOUND: SHX6177

## 2015-01-19 LAB — COMPREHENSIVE METABOLIC PANEL
ALT: 15 U/L (ref 0–35)
AST: 21 U/L (ref 0–37)
Albumin: 3.7 g/dL (ref 3.5–5.2)
Alkaline Phosphatase: 65 U/L (ref 39–117)
Anion gap: 10 (ref 5–15)
BUN: 18 mg/dL (ref 6–23)
CO2: 26 mmol/L (ref 19–32)
Calcium: 9.4 mg/dL (ref 8.4–10.5)
Chloride: 102 mmol/L (ref 96–112)
Creatinine, Ser: 0.99 mg/dL (ref 0.50–1.10)
GFR calc Af Amer: 59 mL/min — ABNORMAL LOW (ref >90)
GFR calc non Af Amer: 51 mL/min — ABNORMAL LOW (ref >90)
Glucose, Bld: 114 mg/dL — ABNORMAL HIGH (ref 70–99)
Potassium: 3.5 mmol/L (ref 3.5–5.1)
Sodium: 138 mmol/L (ref 135–145)
Total Bilirubin: 0.7 mg/dL (ref 0.3–1.2)
Total Protein: 6.6 g/dL (ref 6.0–8.3)

## 2015-01-19 LAB — APTT: aPTT: 25 seconds (ref 24–37)

## 2015-01-19 LAB — GLUCOSE, CAPILLARY: Glucose-Capillary: 102 mg/dL — ABNORMAL HIGH (ref 70–99)

## 2015-01-19 LAB — CBC
HCT: 36.6 % (ref 36.0–46.0)
Hemoglobin: 11.9 g/dL — ABNORMAL LOW (ref 12.0–15.0)
MCH: 27 pg (ref 26.0–34.0)
MCHC: 32.5 g/dL (ref 30.0–36.0)
MCV: 83 fL (ref 78.0–100.0)
Platelets: 246 10*3/uL (ref 150–400)
RBC: 4.41 MIL/uL (ref 3.87–5.11)
RDW: 16.1 % — ABNORMAL HIGH (ref 11.5–15.5)
WBC: 5.6 10*3/uL (ref 4.0–10.5)

## 2015-01-19 LAB — PROTIME-INR
INR: 0.96 (ref 0.00–1.49)
Prothrombin Time: 12.8 seconds (ref 11.6–15.2)

## 2015-01-19 SURGERY — BRONCHOSCOPY, WITH EBUS
Anesthesia: General

## 2015-01-19 MED ORDER — LIDOCAINE HCL (CARDIAC) 20 MG/ML IV SOLN
INTRAVENOUS | Status: DC | PRN
  Administered 2015-01-19: 100 mg via INTRAVENOUS

## 2015-01-19 MED ORDER — NEOSTIGMINE METHYLSULFATE 10 MG/10ML IV SOLN
INTRAVENOUS | Status: DC | PRN
  Administered 2015-01-19: 4 mg via INTRAVENOUS

## 2015-01-19 MED ORDER — ARTIFICIAL TEARS OP OINT
TOPICAL_OINTMENT | OPHTHALMIC | Status: DC | PRN
  Administered 2015-01-19: 1 via OPHTHALMIC

## 2015-01-19 MED ORDER — FENTANYL CITRATE 0.05 MG/ML IJ SOLN
INTRAMUSCULAR | Status: DC | PRN
  Administered 2015-01-19 (×2): 50 ug via INTRAVENOUS

## 2015-01-19 MED ORDER — SUCCINYLCHOLINE CHLORIDE 20 MG/ML IJ SOLN
INTRAMUSCULAR | Status: AC
  Filled 2015-01-19: qty 1

## 2015-01-19 MED ORDER — LIDOCAINE HCL (CARDIAC) 20 MG/ML IV SOLN
INTRAVENOUS | Status: AC
  Filled 2015-01-19: qty 5

## 2015-01-19 MED ORDER — ROCURONIUM BROMIDE 50 MG/5ML IV SOLN
INTRAVENOUS | Status: AC
  Filled 2015-01-19: qty 1

## 2015-01-19 MED ORDER — PROPOFOL 10 MG/ML IV BOLUS
INTRAVENOUS | Status: AC
  Filled 2015-01-19: qty 20

## 2015-01-19 MED ORDER — OXYCODONE HCL 5 MG/5ML PO SOLN: 5.0000 mg | Freq: Once | ORAL | Status: DC | PRN

## 2015-01-19 MED ORDER — ROCURONIUM BROMIDE 100 MG/10ML IV SOLN
INTRAVENOUS | Status: DC | PRN
  Administered 2015-01-19 (×2): 20 mg via INTRAVENOUS

## 2015-01-19 MED ORDER — SODIUM CHLORIDE 0.9 % IV SOLN
10.0000 mg | INTRAVENOUS | Status: DC | PRN
  Administered 2015-01-19: 10 ug/min via INTRAVENOUS

## 2015-01-19 MED ORDER — PROMETHAZINE HCL 25 MG/ML IJ SOLN: 6.2500 mg | INTRAMUSCULAR | Status: DC | PRN

## 2015-01-19 MED ORDER — GLYCOPYRROLATE 0.2 MG/ML IJ SOLN
INTRAMUSCULAR | Status: DC | PRN
  Administered 2015-01-19: 0.6 mg via INTRAVENOUS

## 2015-01-19 MED ORDER — ONDANSETRON HCL 4 MG/2ML IJ SOLN
INTRAMUSCULAR | Status: DC | PRN
  Administered 2015-01-19: 4 mg via INTRAVENOUS

## 2015-01-19 MED ORDER — LACTATED RINGERS IV SOLN
INTRAVENOUS | Status: DC | PRN
  Administered 2015-01-19: 07:00:00 via INTRAVENOUS

## 2015-01-19 MED ORDER — PROPOFOL 10 MG/ML IV BOLUS
INTRAVENOUS | Status: DC | PRN
  Administered 2015-01-19: 140 mg via INTRAVENOUS
  Administered 2015-01-19: 20 mg via INTRAVENOUS

## 2015-01-19 MED ORDER — ARTIFICIAL TEARS OP OINT
TOPICAL_OINTMENT | OPHTHALMIC | Status: AC
  Filled 2015-01-19: qty 7

## 2015-01-19 MED ORDER — ONDANSETRON HCL 4 MG/2ML IJ SOLN
INTRAMUSCULAR | Status: AC
  Filled 2015-01-19: qty 2

## 2015-01-19 MED ORDER — FENTANYL CITRATE 0.05 MG/ML IJ SOLN
INTRAMUSCULAR | Status: AC
  Filled 2015-01-19: qty 5

## 2015-01-19 MED ORDER — FENTANYL CITRATE 0.05 MG/ML IJ SOLN: 25.0000 ug | INTRAMUSCULAR | Status: DC | PRN

## 2015-01-19 MED ORDER — OXYCODONE HCL 5 MG PO TABS: 5.0000 mg | ORAL_TABLET | Freq: Once | ORAL | Status: DC | PRN

## 2015-01-19 MED ORDER — EPINEPHRINE HCL 1 MG/ML IJ SOLN
INTRAMUSCULAR | Status: AC
  Filled 2015-01-19: qty 1

## 2015-01-19 MED ORDER — GLYCOPYRROLATE 0.2 MG/ML IJ SOLN
INTRAMUSCULAR | Status: AC
  Filled 2015-01-19: qty 3

## 2015-01-19 MED ORDER — 0.9 % SODIUM CHLORIDE (POUR BTL) OPTIME
TOPICAL | Status: DC | PRN
  Administered 2015-01-19: 1000 mL

## 2015-01-19 SURGICAL SUPPLY — 22 items
BRUSH CYTOL CELLEBRITY 1.5X140 (MISCELLANEOUS) IMPLANT
CANISTER SUCTION 2500CC (MISCELLANEOUS) ×2 IMPLANT
CONT SPEC 4OZ CLIKSEAL STRL BL (MISCELLANEOUS) ×2 IMPLANT
COVER TABLE BACK 60X90 (DRAPES) ×2 IMPLANT
FORCEPS BIOP RJ4 1.8 (CUTTING FORCEPS) IMPLANT
GAUZE SPONGE 4X4 12PLY STRL (GAUZE/BANDAGES/DRESSINGS) ×2 IMPLANT
GLOVE BIO SURGEON STRL SZ 6.5 (GLOVE) ×2 IMPLANT
KIT CLEAN ENDO COMPLIANCE (KITS) ×4 IMPLANT
KIT ROOM TURNOVER OR (KITS) ×2 IMPLANT
MARKER SKIN DUAL TIP RULER LAB (MISCELLANEOUS) ×2 IMPLANT
NDL BLUNT 18X1 FOR OR ONLY (NEEDLE) IMPLANT
NEEDLE BIOPSY TRANSBRONCH 21G (NEEDLE) IMPLANT
NEEDLE BLUNT 18X1 FOR OR ONLY (NEEDLE) IMPLANT
NEEDLE SYS SONOTIP II EBUSTBNA (NEEDLE) ×2 IMPLANT
NS IRRIG 1000ML POUR BTL (IV SOLUTION) ×2 IMPLANT
OIL SILICONE PENTAX (PARTS (SERVICE/REPAIRS)) ×2 IMPLANT
PAD ARMBOARD 7.5X6 YLW CONV (MISCELLANEOUS) ×4 IMPLANT
SYR 20CC LL (SYRINGE) ×2 IMPLANT
SYR 20ML ECCENTRIC (SYRINGE) ×2 IMPLANT
TOWEL OR 17X24 6PK STRL BLUE (TOWEL DISPOSABLE) ×2 IMPLANT
TRAP SPECIMEN MUCOUS 40CC (MISCELLANEOUS) ×2 IMPLANT
TUBE CONNECTING 12X1/4 (SUCTIONS) ×4 IMPLANT

## 2015-01-19 NOTE — Transfer of Care (Signed)
Immediate Anesthesia Transfer of Care Note  Patient: Judy Roberts  Procedure(s) Performed: Procedure(s): VIDEO BRONCHOSCOPY WITH ENDOBRONCHIAL ULTRASOUND (N/A) LUNG BIOPSY (N/A)  Patient Location: PACU  Anesthesia Type:General  Level of Consciousness: awake and patient cooperative  Airway & Oxygen Therapy: Patient Spontanous Breathing and Patient connected to face mask oxygen  Post-op Assessment: Report given to RN, Post -op Vital signs reviewed and stable and Patient moving all extremities  Post vital signs: Reviewed and stable  Last Vitals:  Filed Vitals:   01/19/15 0642  BP: 122/69  Pulse: 70  Temp: 36.8 C  Resp: 18    Complications: No apparent anesthesia complications

## 2015-01-19 NOTE — Brief Op Note (Signed)
      [address]       [place] [address]             [phone]      01/19/2015  8:57 AM  PATIENT:  Judy Roberts  81 y.o. female  PRE-OPERATIVE DIAGNOSIS:  LUNG CANCER left lower  lobe  POST-OPERATIVE DIAGNOSIS:  Same, high grade malignancynone final ID pending  PROCEDURE:  Procedure(s): VIDEO BRONCHOSCOPY WITH ENDOBRONCHIAL ULTRASOUND (N/A) LUNG BIOPSY (N/A)  SURGEON:  Surgeon(s) and Role:    * Grace Isaac, MD - Primary   ANESTHESIA:   general  EBL:    none  BLOOD ADMINISTERED:none  DRAINS: none   LOCAL MEDICATIONS USED:  NONE  SPECIMEN:  Source of Specimen:  #7 and # 10 R lymph nodes  DISPOSITION OF SPECIMEN:  PATHOLOGY  COUNTS:  YES   DICTATION: .Dragon Dictation  PLAN OF CARE: Discharge to home after PACU  PATIENT DISPOSITION:  PACU - hemodynamically stable.   Delay start of Pharmacological VTE agent (>24hrs) due to surgical blood loss or risk of bleeding: yes

## 2015-01-19 NOTE — H&P (Signed)
[address]       [place] [address]             [phone]                    Haizlee M Mell Kinder Medical Record #[id number] Date of Birth: 1929-09-04  Referring: Dr Argentina Donovan Primary Care: Teressa Lower, MD  Chief Complaint:    Chief Complaint  Patient presents with  . Lung Cancer    eval for bx...PET, PFT'S    History of Present Illness:    JOZEE HAMMER 81 y.o. female is seen in the office   at the request of Dr Bobby Rumpf. Patent  Has a  history of breast cancer,  treated with lumpectomy and radiation in 2006. She had  episode of bronchitis in Nov 2015. Chest xray showed a lung mass, ct and pet scan done. 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  Patient referred for elauation of rt mediastinal &  hilar nodes on PET in setting of left lung mass.  Current Activity/ Functional Status:  Patient is independent with mobility/ambulation, transfers, ADL's, IADL's.   Zubrod Score: At the time of surgery this patient's most appropriate activity status/level should be described as: []     0    Normal activity, no symptoms [x]     1    Restricted in physical strenuous activity but ambulatory, able to do out light work []     2    Ambulatory and capable of self care, unable to do work activities, up and about               >50 % of waking hours                              []     3    Only limited self care, in bed greater than 50% of waking hours []     4    Completely disabled, no self care, confined to bed or chair []     5    Moribund   Past Medical History  Diagnosis Date  . Allergic rhinitis   . Benign essential hypertension   . Complicated bronchitis   . Diabetes mellitus with no complication   . Mitral regurgitation   . Onychomycosis   . Pulmonary nodule   . History of breast cancer   . Hyperlipidemia   . Arthritis     OSTEOARTHRITIS    Past Surgical History  Procedure Laterality Date  . Incisional breast biopsy     . Total abdominal hysterectomy    . Video bronchoscopy Bilateral 12/14/2014    Procedure: VIDEO BRONCHOSCOPY WITH FLUORO;  Surgeon: Elsie Stain, MD;  Location: [facility];  Service: Cardiopulmonary;  Laterality: Bilateral;  . Breast surgery Right     LUMPECTOMY  . Breast cyst excision Left     Family History  Problem Relation Age of Onset  . Pancreatic cancer Sister   . Diabetes Mother   . Alzheimer's disease Mother   . Stroke Father   . Hypertension Father   . Other Brother     MVA  . Drug abuse Brother     History   Social History  . Marital Status: Widowed    Spouse Name: N/A  . Number of Children: N/A  . Years of Education: N/A   Occupational History  .  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 Smoking status: Former Smoker -- 0.30 packs/day for 45 years    Types: Cigarettes    Quit date: 12/04/2004  . Smokeless tobacco: Never Used  . Alcohol Use: No  . Drug Use: No  . Sexual Activity: Not on file   Other Topics Concern  . Not on file   Social History Narrative    History  Smoking status  . Former Smoker -- 0.30 packs/day for 45 years  . Types: Cigarettes  . Quit date: 12/04/2004  Smokeless tobacco  . Never Used    History  Alcohol Use No     Allergies  Allergen Reactions  . Asa [Aspirin]     Gi upset  . Atorvastatin     GI upset  . Levofloxacin     dizziness    Current Facility-Administered Medications  Medication Dose Route Frequency Provider Last Rate Last Dose  . fentaNYL (SUBLIMAZE) injection 25-50 mcg  25-50 mcg Intravenous Q5 min PRN Tiajuana Amass, MD      . oxyCODONE (Oxy IR/ROXICODONE) immediate release tablet 5 mg  5 mg Oral Once PRN Tiajuana Amass, MD       Or  . oxyCODONE (ROXICODONE) 5 MG/5ML solution 5 mg  5 mg Oral Once PRN Tiajuana Amass, MD      . promethazine (PHENERGAN) injection 6.25-12.5 mg  6.25-12.5 mg Intravenous Q15 min PRN Tiajuana Amass, MD           Review of Systems:     Cardiac Review of Systems: Y or N  Chest Pain [  n  ]  Resting SOB [  n ] Exertional SOB  Blue.Reese  ]  Orthopnea [ n ]   Pedal Edema [   ]    Palpitations [  ] Syncope  [  ]   Presyncope [   ]  General Review of Systems: [Y] = yes [  ]=no Constitional: recent weight change [n  ];  Wt loss over the last 3 months [   ] anorexia [  ]; fatigue [  ]; nausea [  ]; night sweats [  ]; fever [  ]; or chills [  ];          Dental: poor dentition[  ]; Last Dentist visit:   Eye : blurred vision [  ]; diplopia [   ]; vision changes [  ];  Amaurosis fugax[  ]; Resp: cough [  ];  wheezing[  ];  hemoptysis[  ]; shortness of breath[  ]; paroxysmal nocturnal dyspnea[  ]; dyspnea on exertion[  ]; or orthopnea[  ];  GI:  gallstones[  ], vomiting[  ];  dysphagia[  ]; melena[  ];  hematochezia [  ]; heartburn[  ];   Hx of  Colonoscopy[  ]; GU: kidney stones [  ]; hematuria[  ];   dysuria [  ];  nocturia[  ];  history of     obstruction [  ]; urinary frequency [  ]             Skin: rash, swelling[  ];, hair loss[  ];  peripheral edema[  ];  or itching[  ]; Musculosketetal: myalgias[  ];  joint swelling[  ];  joint erythema[  ];  joint pain[  ];  back pain[  ];  Heme/Lymph: bruising[  ];  bleeding[  ];  anemia[  ];  Neuro: TIA[  ];  headaches[  ];  stroke[  ];  vertigo[  ];  seizures[  ];   paresthesias[  ];  difficulty walking[  ];  Psych:depression[  ]; anxiety[  ];  Endocrine: diabetes[  ];  thyroid dysfunction[  ];  Immunizations: Flu up to date [  ]; Pneumococcal up to date [  ];  Other:  Physical Exam: BP 133/54 mmHg  Pulse 106  Temp(Src) 97.8 F (36.6 C) (Oral)  Resp 18  SpO2 99%  PHYSICAL EXAMINATION: General appearance: alert, cooperative, appears stated age and no distress Head: Normocephalic, without obvious abnormality, atraumatic Neck: no adenopathy, no carotid bruit, no JVD, supple, symmetrical, trachea midline and thyroid not enlarged, symmetric, no  tenderness/mass/nodules Lymph nodes: Cervical, supraclavicular, and axillary nodes normal. Resp: clear to auscultation bilaterally Back: symmetric, no curvature. ROM normal. No CVA tenderness. Cardio: regular rate and rhythm, S1, S2 normal, no murmur, click, rub or gallop and normal apical impulse GI: soft, non-tender; bowel sounds normal; no masses,  no organomegaly Extremities: extremities normal, atraumatic, no cyanosis or edema and Homans sign is negative, no sign of DVT Neurologic: Alert and oriented X 3, normal strength and tone. Normal symmetric reflexes. Normal coordination and gait  Diagnostic Studies & Laboratory data:     Recent Radiology Findings:  PET scan and Ct reviewed / [facility] at [facility]. Left lung mass and hypermetabolic right hilar nodes  I have independently reviewed the above radiologic studies.  Recent Lab Findings: Lab Results  Component Value Date   WBC 5.6 01/19/2015   HGB 11.9* 01/19/2015   HCT 36.6 01/19/2015   PLT 246 01/19/2015   GLUCOSE 114* 01/19/2015   ALT 15 01/19/2015   AST 21 01/19/2015   NA 138 01/19/2015   K 3.5 01/19/2015   CL 102 01/19/2015   CREATININE 0.99 01/19/2015   BUN 18 01/19/2015   CO2 26 01/19/2015   INR 0.96 01/19/2015      Assessment / Plan:   I have recommended to patient to proceed with EBUS and bx of mediastinal nodes to better stage known malignancy. Risk and options discussed patient is agreeable.    The goals risks and alternatives of the planned surgical procedure  Bronchoscopy,EBUS and bx of mediastinal nodes   have been discussed with the patient in detail. The risks of the procedure including death, infection, stroke, myocardial infarction, bleeding, blood transfusion have all been discussed specifically.  I have quoted Marijean Niemann a 2 % of perioperative mortality and a complication rate as high as 10 %. The patient's questions have been answered.Phill Myron Mireles is willing  to proceed with the planned  procedure.  Grace Isaac MD      [address] [place] [place] [address] Office [phone]   Beeper [phone]  01/19/2015 9:17 AM

## 2015-01-19 NOTE — Discharge Instructions (Signed)
Flexible Bronchoscopy, Care After Refer to this sheet in the next few weeks. These instructions provide you with information on caring for yourself after your procedure. Your health care provider may also give you more specific instructions. Your treatment has been planned according to current medical practices, but problems sometimes occur. Call your health care provider if you have any problems or questions after your procedure.  WHAT TO EXPECT AFTER THE PROCEDURE It is normal to have the following symptoms for 24-48 hours after the procedure:   Increased cough.  Low-grade fever.  Sore throat or hoarse voice.  Small streaks of blood in your thick spit (sputum) if tissue samples were taken (biopsy). HOME CARE INSTRUCTIONS   Do not eat or drink anything for 2 hours after your procedure. Your nose and throat were numbed by medicine. If you try to eat or drink before the medicine wears off, food or drink could go into your lungs or you could burn yourself. After the numbness is gone and your cough and gag reflexes have returned, you may eat soft food and drink liquids slowly.   The day after the procedure, you can go back to your normal diet.   You may resume normal activities.   Keep all follow-up visits as directed by your health care provider. It is important to keep all your appointments, especially if tissue samples were taken for testing (biopsy). SEEK IMMEDIATE MEDICAL CARE IF:   You have increasing shortness of breath.   You become light-headed or faint.   You have chest pain.   You have any new concerning symptoms.  You cough up more than a small amount of blood.  The amount of blood you cough up increases. MAKE SURE YOU:  Understand these instructions.  Will watch your condition.  Will get help right away if you are not doing well or get worse. Document Released: 06/09/2005 Document Revised: 04/06/2014 Document Reviewed: 07/25/2013 [facility] Patient Information  2015 [place], [place]. This information is not intended to replace advice given to you by your health care provider. Make sure you discuss any questions you have with your health care provider.   What to eat:  For your first meals, you should eat lightly; only small meals initially.  If you do not have nausea, you may eat larger meals.  Avoid spicy, greasy and heavy food.    General Anesthesia, Adult, Care After  Refer to this sheet in the next few weeks. These instructions provide you with information on caring for yourself after your procedure. Your health care provider may also give you more specific instructions. Your treatment has been planned according to current medical practices, but problems sometimes occur. Call your health care provider if you have any problems or questions after your procedure.  WHAT TO EXPECT AFTER THE PROCEDURE  After the procedure, it is typical to experience:  Sleepiness.  Nausea and vomiting. HOME CARE INSTRUCTIONS  For the first 24 hours after general anesthesia:  Have a responsible person with you.  Do not drive a car. If you are alone, do not take public transportation.  Do not drink alcohol.  Do not take medicine that has not been prescribed by your health care provider.  Do not sign important papers or make important decisions.  You may resume a normal diet and activities as directed by your health care provider.  Change bandages (dressings) as directed.  If you have questions or problems that seem related to general anesthesia, call the hospital and ask  for the anesthetist or anesthesiologist on call. SEEK MEDICAL CARE IF:  You have nausea and vomiting that continue the day after anesthesia.  You develop a rash. SEEK IMMEDIATE MEDICAL CARE IF:  You have difficulty breathing.  You have chest pain.  You have any allergic problems. Document Released: 02/26/2001 Document Revised: 07/23/2013 Document Reviewed: 06/05/2013  [facility] Patient Information  2014 [place], [place].   Sore Throat    A sore throat is a painful, burning, sore, or scratchy feeling of the throat. There may be pain or tenderness when swallowing or talking. You may have other symptoms with a sore throat. These include coughing, sneezing, fever, or a swollen neck. A sore throat is often the first sign of another sickness. These sicknesses may include a cold, flu, strep throat, or an infection called mono. Most sore throats go away without medical treatment.  HOME CARE  Only take medicine as told by your doctor.  Drink enough fluids to keep your pee (urine) clear or pale yellow.  Rest as needed.  Try using throat sprays, lozenges, or suck on hard candy (if older than 4 years or as told).  Sip warm liquids, such as broth, herbal tea, or warm water with honey. Try sucking on frozen ice pops or drinking cold liquids.  Rinse the mouth (gargle) with salt water. Mix 1 teaspoon salt with 8 ounces of water.  Do not smoke. Avoid being around others when they are smoking.  Put a humidifier in your bedroom at night to moisten the air. You can also turn on a hot shower and sit in the bathroom for 5-10 minutes. Be sure the bathroom door is closed. GET HELP RIGHT AWAY IF:  You have trouble breathing.  You cannot swallow fluids, soft foods, or your spit (saliva).  You have more puffiness (swelling) in the throat.  Your sore throat does not get better in 7 days.  You feel sick to your stomach (nauseous) and throw up (vomit).  You have a fever or lasting symptoms for more than 2-3 days.  You have a fever and your symptoms suddenly get worse. MAKE SURE YOU:  Understand these instructions.  Will watch your condition.  Will get help right away if you are not doing well or get worse. Document Released: 08/29/2008 Document Revised: 08/14/2012 Document Reviewed: 07/28/2012  [facility] Patient Information 2015 [place], [place]. This information is not intended to replace advice given to you by  your health care provider. Make sure you discuss any questions you have with your health care provider.

## 2015-01-19 NOTE — Anesthesia Postprocedure Evaluation (Signed)
  Anesthesia Post-op Note  Patient: Judy Roberts  Procedure(s) Performed: Procedure(s): VIDEO BRONCHOSCOPY WITH ENDOBRONCHIAL ULTRASOUND (N/A) LUNG BIOPSY (N/A)  Patient Location: PACU  Anesthesia Type:General  Level of Consciousness: awake  Airway and Oxygen Therapy: Patient Spontanous Breathing  Post-op Pain: none  Post-op Assessment: Post-op Vital signs reviewed, Patient's Cardiovascular Status Stable, Respiratory Function Stable, Patent Airway, No signs of Nausea or vomiting and Pain level controlled  Post-op Vital Signs: Reviewed and stable  Last Vitals:  Filed Vitals:   01/19/15 0959  BP: 109/47  Pulse: 83  Temp:   Resp: 15    Complications: No apparent anesthesia complications

## 2015-01-19 NOTE — Progress Notes (Signed)
[address]       [name] [address]             [id number]                    Bhavana M Arango [place] Medical Record #[id number] Date of Birth: 1929-08-04  Referring: Dr Argentina Donovan Primary Care: Teressa Lower, MD  Chief Complaint:    Chief Complaint  Patient presents with  . Lung Cancer    eval for bx...PET, PFT'S    History of Present Illness:    Judy Roberts 81 y.o. female is seen in the office   at the request of Dr Bobby Rumpf. Patent  Has a  history of breast cancer,  treated with lumpectomy and radiation in 2006. She had  episode of bronchitis in Nov 2015. Chest xray showed a lung mass, ct and pet scan done. 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  Patient referred for elauation of rt mediastinal &  hilar nodes on PET in setting of left lung mass.  Current Activity/ Functional Status:  Patient is independent with mobility/ambulation, transfers, ADL's, IADL's.   Zubrod Score: At the time of surgery this patient's most appropriate activity status/level should be described as: []     0    Normal activity, no symptoms [x]     1    Restricted in physical strenuous activity but ambulatory, able to do out light work []     2    Ambulatory and capable of self care, unable to do work activities, up and about               >50 % of waking hours                              []     3    Only limited self care, in bed greater than 50% of waking hours []     4    Completely disabled, no self care, confined to bed or chair []     5    Moribund   Past Medical History  Diagnosis Date  . Allergic rhinitis   . Benign essential hypertension   . Complicated bronchitis   . Diabetes mellitus with no complication   . Mitral regurgitation   . Onychomycosis   . Pulmonary nodule   . History of breast cancer   . Hyperlipidemia   . Arthritis     OSTEOARTHRITIS    Past Surgical History  Procedure Laterality Date  . Incisional breast biopsy     . Total abdominal hysterectomy    . Video bronchoscopy Bilateral 12/14/2014    Procedure: VIDEO BRONCHOSCOPY WITH FLUORO;  Surgeon: Elsie Stain, MD;  Location: [facility];  Service: Cardiopulmonary;  Laterality: Bilateral;  . Breast surgery Right     LUMPECTOMY  . Breast cyst excision Left     Family History  Problem Relation Age of Onset  . Pancreatic cancer Sister   . Diabetes Mother   . Alzheimer's disease Mother   . Stroke Father   . Hypertension Father   . Other Brother     MVA  . Drug abuse Brother     History   Social History  . Marital Status: Widowed    Spouse Name: N/A  . Number of Children: N/A  . Years of Education: N/A   Occupational History  .  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 Smoking status: Former Smoker -- 0.30 packs/day for 45 years    Types: Cigarettes    Quit date: 12/04/2004  . Smokeless tobacco: Never Used  . Alcohol Use: No  . Drug Use: No  . Sexual Activity: Not on file   Other Topics Concern  . Not on file   Social History Narrative    History  Smoking status  . Former Smoker -- 0.30 packs/day for 45 years  . Types: Cigarettes  . Quit date: 12/04/2004  Smokeless tobacco  . Never Used    History  Alcohol Use No     Allergies  Allergen Reactions  . Asa [Aspirin]     Gi upset  . Atorvastatin     GI upset  . Levofloxacin     dizziness    No current facility-administered medications for this encounter.   Facility-Administered Medications Ordered in Other Encounters  Medication Dose Route Frequency Provider Last Rate Last Dose  . lactated ringers infusion    Continuous PRN Rogers Blocker, CRNA          Review of Systems:     Cardiac Review of Systems: Y or N  Chest Pain [  n  ]  Resting SOB [  n ] Exertional SOB  Blue.Reese  ]  Orthopnea [ n ]   Pedal Edema [   ]    Palpitations [  ] Syncope  [  ]   Presyncope [   ]  General Review of Systems: [Y] = yes [  ]=no Constitional:  recent weight change [n  ];  Wt loss over the last 3 months [   ] anorexia [  ]; fatigue [  ]; nausea [  ]; night sweats [  ]; fever [  ]; or chills [  ];          Dental: poor dentition[  ]; Last Dentist visit:   Eye : blurred vision [  ]; diplopia [   ]; vision changes [  ];  Amaurosis fugax[  ]; Resp: cough [  ];  wheezing[  ];  hemoptysis[  ]; shortness of breath[  ]; paroxysmal nocturnal dyspnea[  ]; dyspnea on exertion[  ]; or orthopnea[  ];  GI:  gallstones[  ], vomiting[  ];  dysphagia[  ]; melena[  ];  hematochezia [  ]; heartburn[  ];   Hx of  Colonoscopy[  ]; GU: kidney stones [  ]; hematuria[  ];   dysuria [  ];  nocturia[  ];  history of     obstruction [  ]; urinary frequency [  ]             Skin: rash, swelling[  ];, hair loss[  ];  peripheral edema[  ];  or itching[  ]; Musculosketetal: myalgias[  ];  joint swelling[  ];  joint erythema[  ];  joint pain[  ];  back pain[  ];  Heme/Lymph: bruising[  ];  bleeding[  ];  anemia[  ];  Neuro: TIA[  ];  headaches[  ];  stroke[  ];  vertigo[  ];  seizures[  ];   paresthesias[  ];  difficulty walking[  ];  Psych:depression[  ]; anxiety[  ];  Endocrine: diabetes[  ];  thyroid dysfunction[  ];  Immunizations: Flu up to date [  ]; Pneumococcal up to date [  ];  Other:  Physical Exam: BP 122/69 mmHg  Pulse 70  Temp(Src) 98.3 F (36.8 C) (Oral)  Resp 18  SpO2 100%  PHYSICAL EXAMINATION: General appearance: alert, cooperative, appears stated age and no distress Head: Normocephalic, without obvious abnormality, atraumatic Neck: no adenopathy, no carotid bruit, no JVD, supple, symmetrical, trachea midline and thyroid not enlarged, symmetric, no tenderness/mass/nodules Lymph nodes: Cervical, supraclavicular, and axillary nodes normal. Resp: clear to auscultation bilaterally Back: symmetric, no curvature. ROM normal. No CVA tenderness. Cardio: regular rate and rhythm, S1, S2 normal, no murmur, click, rub or gallop and normal apical  impulse GI: soft, non-tender; bowel sounds normal; no masses,  no organomegaly Extremities: extremities normal, atraumatic, no cyanosis or edema and Homans sign is negative, no sign of DVT Neurologic: Alert and oriented X 3, normal strength and tone. Normal symmetric reflexes. Normal coordination and gait  Diagnostic Studies & Laboratory data:     Recent Radiology Findings:  PET scan and Ct reviewed / done at [facility]. Left lung mass and hypermetabolic right hilar nodes  I have independently reviewed the above radiologic studies.  Recent Lab Findings: Lab Results  Component Value Date   WBC 5.6 01/19/2015   HGB 11.9* 01/19/2015   HCT 36.6 01/19/2015   PLT 246 01/19/2015   GLUCOSE 114* 01/19/2015   ALT 15 01/19/2015   AST 21 01/19/2015   NA 138 01/19/2015   K 3.5 01/19/2015   CL 102 01/19/2015   CREATININE 0.99 01/19/2015   BUN 18 01/19/2015   CO2 26 01/19/2015   INR 0.96 01/19/2015      Assessment / Plan:   I have recommended to patient to proceed with EBUS and bx of mediastinal nodes to better stage known malignancy. Risk and options discussed patient is agreeable.    The goals risks and alternatives of the planned surgical procedure  Bronchoscopy,EBUS and bx of mediastinal nodes   have been discussed with the patient in detail. The risks of the procedure including death, infection, stroke, myocardial infarction, bleeding, blood transfusion have all been discussed specifically.  I have quoted Judy Roberts a 2 % of perioperative mortality and a complication rate as high as 10 %. The patient's questions have been answered.Judy Roberts is willing  to proceed with the planned procedure.  Grace Isaac MD      [address] [place] [address] Office [phone]   Beeper [phone]  01/19/2015 7:20 AM

## 2015-01-19 NOTE — Anesthesia Preprocedure Evaluation (Addendum)
Anesthesia Evaluation  Patient identified by MRN, date of birth, ID band Patient awake    Reviewed: Allergy & Precautions, NPO status , Patient's Chart, lab work & pertinent test results  Airway Mallampati: III  TM Distance: >3 FB Neck ROM: Full    Dental  (+) Lower Dentures, Upper Dentures   Pulmonary former smoker,  breath sounds clear to auscultation        Cardiovascular hypertension, Pt. on medications Rhythm:Regular Rate:Normal     Neuro/Psych negative neurological ROS     GI/Hepatic negative GI ROS, Neg liver ROS,   Endo/Other  diabetes, Type 2, Oral Hypoglycemic Agents  Renal/GU negative Renal ROS     Musculoskeletal   Abdominal   Peds  Hematology negative hematology ROS (+)   Anesthesia Other Findings   Reproductive/Obstetrics                            Anesthesia Physical Anesthesia Plan  ASA: II  Anesthesia Plan: General   Post-op Pain Management:    Induction: Intravenous  Airway Management Planned: Oral ETT  Additional Equipment:   Intra-op Plan:   Post-operative Plan: Extubation in OR  Informed Consent: I have reviewed the patients History and Physical, chart, labs and discussed the procedure including the risks, benefits and alternatives for the proposed anesthesia with the patient or authorized representative who has indicated his/her understanding and acceptance.   Dental advisory given  Plan Discussed with: CRNA  Anesthesia Plan Comments:         Anesthesia Quick Evaluation

## 2015-01-20 ENCOUNTER — Encounter: Payer: Self-pay | Admitting: Critical Care Medicine

## 2015-01-20 NOTE — Op Note (Signed)
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  High-grade malignancy in the mediastinal nodes, final path pending.  PROCEDURE PERFORMED:  Bronchoscopy with EBUS and biopsy of #7 and 10R lymph nodes.  SURGEON:  Lanelle Bal, MD  BRIEF HISTORY:  The patient is an 81 year old female with a left lower lobe lung mass which has been biopsy proven to be squamous cell. Following this biopsy done by Dr. Joya Gaskins, the PET scan revealed evidence of mediastinal hypermetabolic nodes at #7 and in the right chest.  The patient previously had a history of breast cancer in addition to the squamous cell cancer of the left lower lobe.  She is referred by Dr. Lavera Guise for EBUS to fully stage both the type and the extent of the patient's malignancy.  Risks and options discussed with the patient in detail.  She agreed and signed informed consent.  DESCRIPTION OF PROCEDURE:  The patient underwent general endotracheal anesthesia without incident.  Appropriate time-out was performed. Initially, a single-lumen endotracheal tube was placed.  Through this endotracheal tube, fiberoptic bronchoscopy was performed at the subsegmental level without evidence of endobronchial lesions.  There were some whitish secretions present that were removed.  An EBUS scope was then introduced into the trachea and at the carina with the EBUS scope placed into the right mainstem bronchus.  A large #7 node was easily identified.   Multiple passes with the transbronchial needle biopsy and aspirations were performed.  Initial passes were submitted for quick stain.  The remainder of the tissue was placed in the cell block.  We then proceeded with the EBUS scope into the right mainstem bronchus and a 2nd enlarged node in the 10R level was identified. Additional multiple passes with the transbronchial biopsy needle were performed in this area also and submitted as 10R lymph node.  Initial quick smear showed poorly differentiated carcinoma.  The remaining tissue was placed into the satellite for further studies.  The EBUS scope was removed.  There was very minimal bleeding in tracheobronchial tree.  The tracheobronchial tree was suctioned out well and with no evidence of active bleeding.  The scope was removed.  The patient was awakened and extubated in the operating room having tolerated the procedure without obvious complication.  She was transferred to the recovery room for postoperative care.     Lanelle Bal, MD     EG/MEDQ  D:  01/19/2015  T:  01/20/2015  Job:  [id number]  cc:   Marice Potter, MD

## 2015-01-21 ENCOUNTER — Encounter (HOSPITAL_COMMUNITY): Payer: Self-pay | Admitting: Cardiothoracic Surgery

## 2015-02-22 ENCOUNTER — Encounter: Payer: Self-pay | Admitting: Critical Care Medicine

## 2015-07-21 IMAGING — CR DG CHEST 2V
2 series · 2 of 2 positions shown · non-contrast
Comparison: 11/09/2014

CLINICAL DATA: Preop for malignant neoplasm of lung.

EXAM:
CHEST  2 VIEW

[w chest pa]
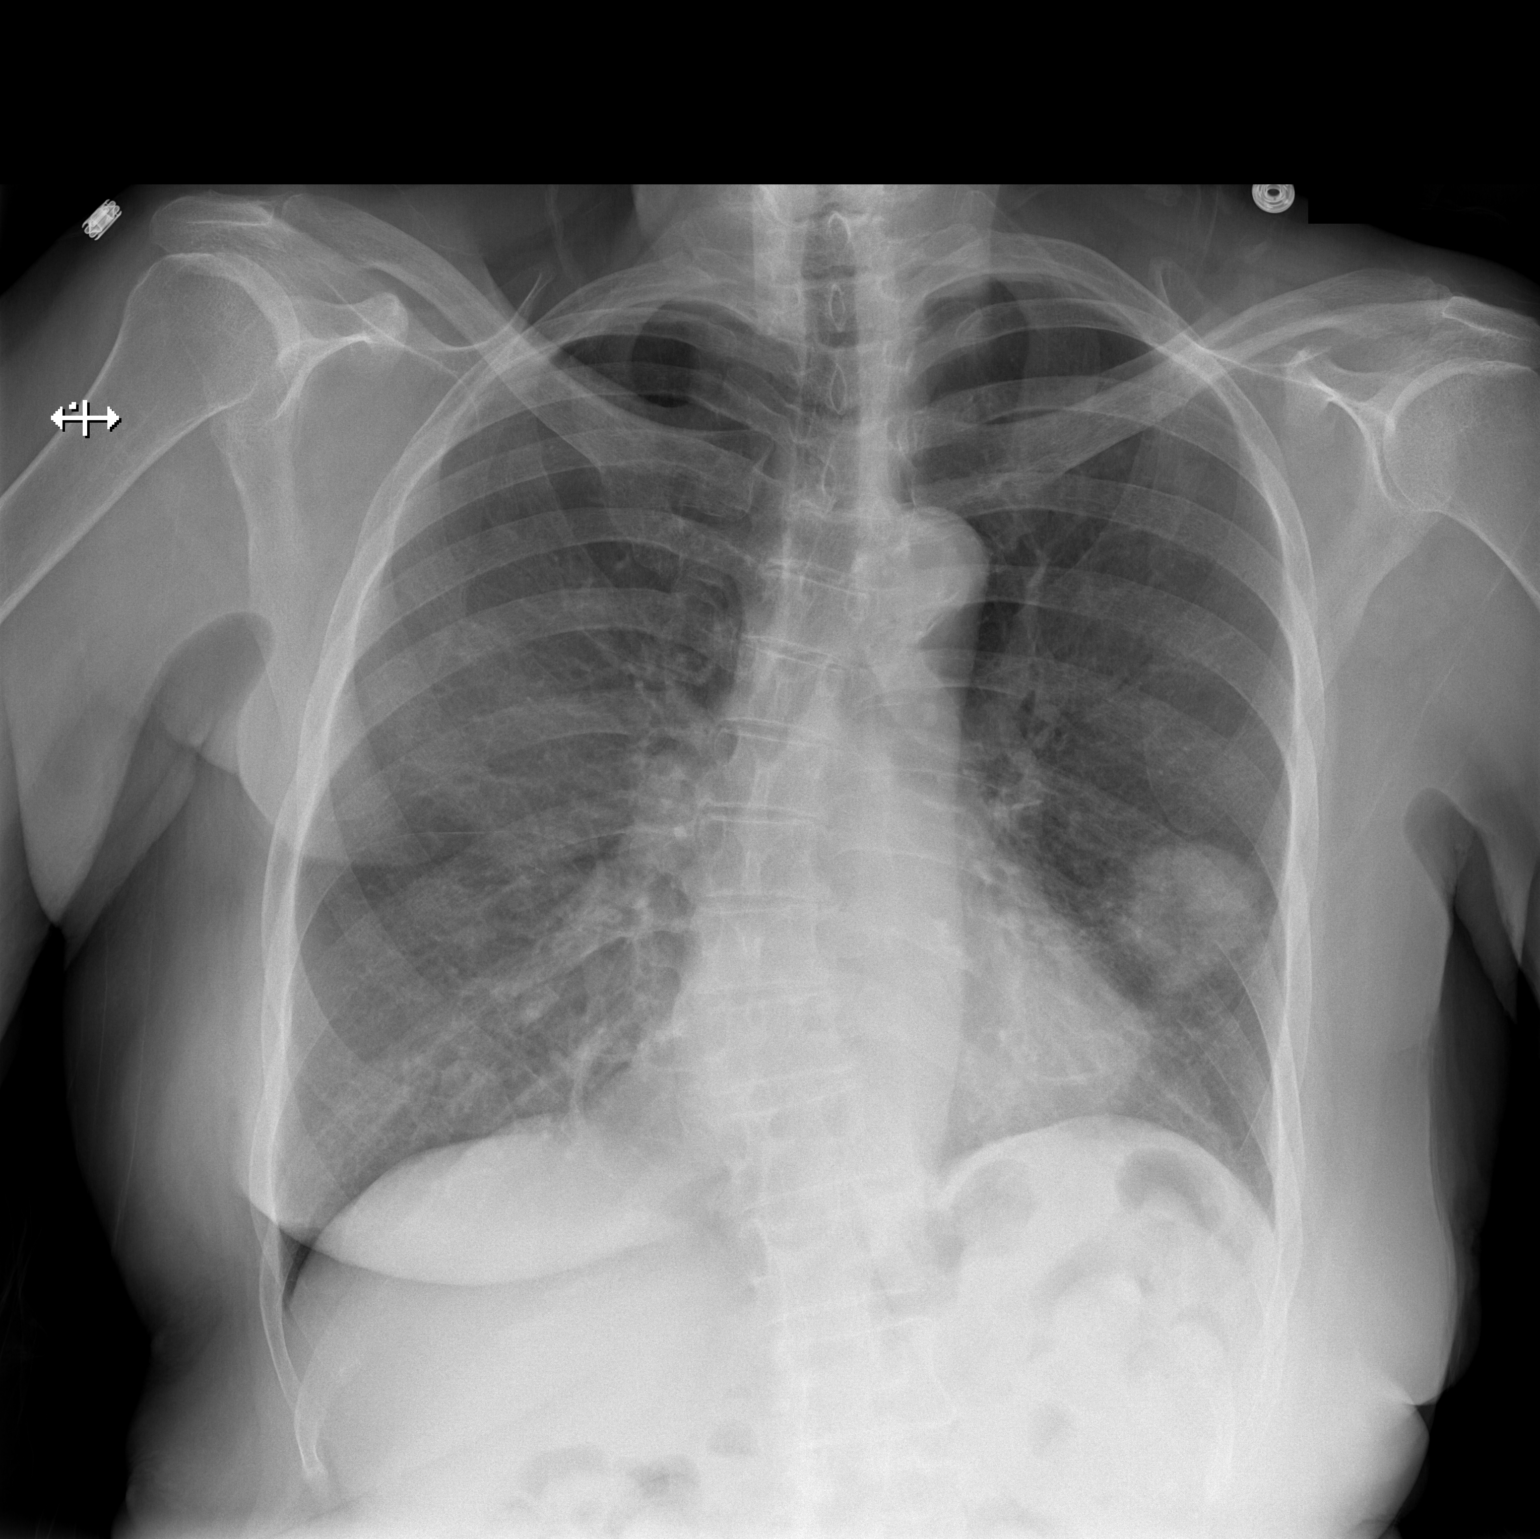

[w chest lat]
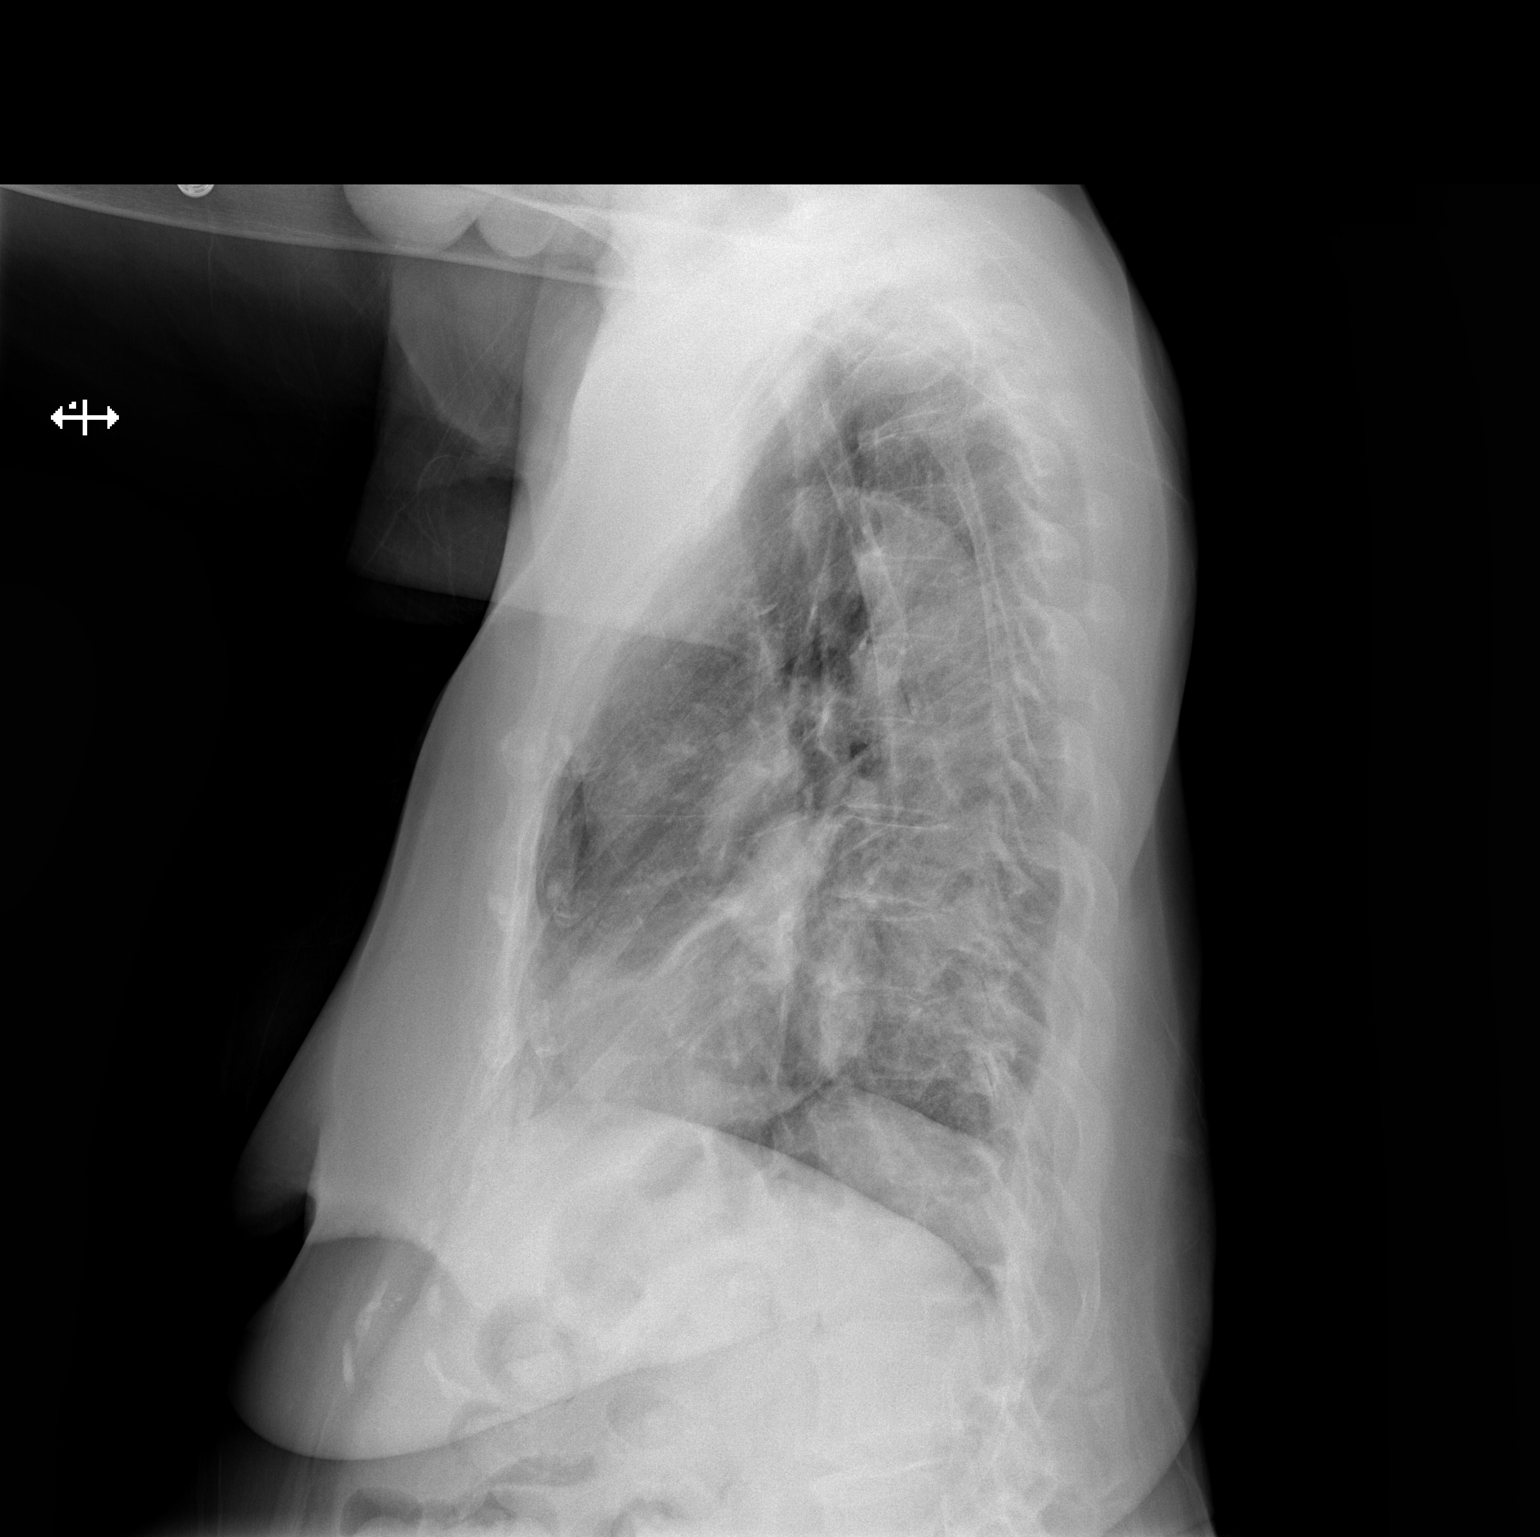

[2 of 2 positions shown; findings below may reference images not displayed]

FINDINGS: The left lung mass is unchanged. No acute infiltrate or congestive
failure is evident. Hilar, mediastinal and cardiac contours are
unremarkable and unchanged. There are no effusions.
IMPRESSION: Unchanged left lung mass.  No superimposed acute findings.

## 2015-11-02 DIAGNOSIS — Z853 Personal history of malignant neoplasm of breast: Secondary | ICD-10-CM | POA: Diagnosis not present

## 2016-06-30 DIAGNOSIS — Z923 Personal history of irradiation: Secondary | ICD-10-CM | POA: Diagnosis not present

## 2016-06-30 DIAGNOSIS — Z85118 Personal history of other malignant neoplasm of bronchus and lung: Secondary | ICD-10-CM | POA: Diagnosis not present

## 2016-10-31 DIAGNOSIS — R05 Cough: Secondary | ICD-10-CM | POA: Diagnosis not present

## 2016-10-31 DIAGNOSIS — Z85118 Personal history of other malignant neoplasm of bronchus and lung: Secondary | ICD-10-CM | POA: Diagnosis not present

## 2016-11-03 DIAGNOSIS — M25551 Pain in right hip: Secondary | ICD-10-CM | POA: Diagnosis not present

## 2016-11-03 DIAGNOSIS — I951 Orthostatic hypotension: Secondary | ICD-10-CM | POA: Diagnosis not present

## 2016-11-03 DIAGNOSIS — Z853 Personal history of malignant neoplasm of breast: Secondary | ICD-10-CM | POA: Diagnosis not present

## 2016-11-03 DIAGNOSIS — C3432 Malignant neoplasm of lower lobe, left bronchus or lung: Secondary | ICD-10-CM | POA: Diagnosis not present

## 2017-03-12 DIAGNOSIS — Z85118 Personal history of other malignant neoplasm of bronchus and lung: Secondary | ICD-10-CM | POA: Diagnosis not present

## 2017-09-17 DIAGNOSIS — Z923 Personal history of irradiation: Secondary | ICD-10-CM | POA: Diagnosis not present

## 2017-09-17 DIAGNOSIS — Z85118 Personal history of other malignant neoplasm of bronchus and lung: Secondary | ICD-10-CM | POA: Diagnosis not present

## 2017-09-17 DIAGNOSIS — Z9221 Personal history of antineoplastic chemotherapy: Secondary | ICD-10-CM | POA: Diagnosis not present

## 2018-03-27 DIAGNOSIS — Z923 Personal history of irradiation: Secondary | ICD-10-CM | POA: Diagnosis not present

## 2018-03-27 DIAGNOSIS — Z85118 Personal history of other malignant neoplasm of bronchus and lung: Secondary | ICD-10-CM | POA: Diagnosis not present

## 2018-03-27 DIAGNOSIS — Z9221 Personal history of antineoplastic chemotherapy: Secondary | ICD-10-CM

## 2018-10-02 DIAGNOSIS — M25551 Pain in right hip: Secondary | ICD-10-CM

## 2018-10-02 DIAGNOSIS — Z923 Personal history of irradiation: Secondary | ICD-10-CM

## 2018-10-02 DIAGNOSIS — Z85118 Personal history of other malignant neoplasm of bronchus and lung: Secondary | ICD-10-CM

## 2018-10-02 DIAGNOSIS — Z9221 Personal history of antineoplastic chemotherapy: Secondary | ICD-10-CM

## 2019-04-01 DIAGNOSIS — L8990 Pressure ulcer of unspecified site, unspecified stage: Secondary | ICD-10-CM | POA: Diagnosis not present

## 2019-04-01 DIAGNOSIS — I10 Essential (primary) hypertension: Secondary | ICD-10-CM

## 2019-04-01 DIAGNOSIS — W19XXXA Unspecified fall, initial encounter: Secondary | ICD-10-CM | POA: Diagnosis not present

## 2019-04-01 DIAGNOSIS — I639 Cerebral infarction, unspecified: Secondary | ICD-10-CM | POA: Diagnosis not present

## 2019-04-01 DIAGNOSIS — M6282 Rhabdomyolysis: Secondary | ICD-10-CM

## 2019-04-01 DIAGNOSIS — E860 Dehydration: Secondary | ICD-10-CM

## 2019-04-26 ENCOUNTER — Other Ambulatory Visit (HOSPITAL_COMMUNITY): Payer: Self-pay

## 2019-04-26 ENCOUNTER — Inpatient Hospital Stay
Admission: RE | Admit: 2019-04-26 | Discharge: 2019-06-04 | Disposition: E | Payer: Medicare Other | Attending: Internal Medicine | Admitting: Internal Medicine

## 2019-04-26 DIAGNOSIS — I5023 Acute on chronic systolic (congestive) heart failure: Secondary | ICD-10-CM | POA: Diagnosis present

## 2019-04-26 DIAGNOSIS — I471 Supraventricular tachycardia: Secondary | ICD-10-CM | POA: Diagnosis present

## 2019-04-26 DIAGNOSIS — R52 Pain, unspecified: Secondary | ICD-10-CM

## 2019-04-26 DIAGNOSIS — J9621 Acute and chronic respiratory failure with hypoxia: Secondary | ICD-10-CM | POA: Diagnosis present

## 2019-04-26 DIAGNOSIS — J181 Lobar pneumonia, unspecified organism: Secondary | ICD-10-CM | POA: Diagnosis present

## 2019-04-26 DIAGNOSIS — J811 Chronic pulmonary edema: Secondary | ICD-10-CM

## 2019-04-26 DIAGNOSIS — J90 Pleural effusion, not elsewhere classified: Secondary | ICD-10-CM

## 2019-04-26 DIAGNOSIS — J9690 Respiratory failure, unspecified, unspecified whether with hypoxia or hypercapnia: Secondary | ICD-10-CM

## 2019-04-26 DIAGNOSIS — Z0189 Encounter for other specified special examinations: Secondary | ICD-10-CM

## 2019-04-26 DIAGNOSIS — T85598A Other mechanical complication of other gastrointestinal prosthetic devices, implants and grafts, initial encounter: Secondary | ICD-10-CM

## 2019-04-26 DIAGNOSIS — Z4659 Encounter for fitting and adjustment of other gastrointestinal appliance and device: Secondary | ICD-10-CM

## 2019-04-26 HISTORY — DX: Supraventricular tachycardia: I47.1

## 2019-04-26 HISTORY — DX: Acute on chronic systolic (congestive) heart failure: I50.23

## 2019-04-26 HISTORY — DX: Pleural effusion, not elsewhere classified: J90

## 2019-04-26 HISTORY — DX: Supraventricular tachycardia, unspecified: I47.10

## 2019-04-26 HISTORY — DX: Acute and chronic respiratory failure with hypoxia: J96.21

## 2019-04-26 HISTORY — DX: Lobar pneumonia, unspecified organism: J18.1

## 2019-04-26 LAB — BLOOD GAS, ARTERIAL
Acid-Base Excess: 14.5 mmol/L — ABNORMAL HIGH (ref 0.0–2.0)
Acid-Base Excess: 15.7 mmol/L — ABNORMAL HIGH (ref 0.0–2.0)
Bicarbonate: 42.7 mmol/L — ABNORMAL HIGH (ref 20.0–28.0)
Bicarbonate: 42.4 mmol/L — ABNORMAL HIGH (ref 20.0–28.0)
Delivery systems: POSITIVE
Expiratory PAP: 10
FIO2: 30
Inspiratory PAP: 20
O2 Content: 10 L/min
O2 Saturation: 99.2 %
O2 Saturation: 96.3 %
Patient temperature: 98.6
Patient temperature: 98.6
pCO2 arterial: 112 mmHg (ref 32.0–48.0)
pCO2 arterial: 83.5 mmHg (ref 32.0–48.0)
pH, Arterial: 7.205 — ABNORMAL LOW (ref 7.350–7.450)
pH, Arterial: 7.326 — ABNORMAL LOW (ref 7.350–7.450)
pO2, Arterial: 220 mmHg — ABNORMAL HIGH (ref 83.0–108.0)
pO2, Arterial: 81.9 mmHg — ABNORMAL LOW (ref 83.0–108.0)

## 2019-04-27 LAB — CBC WITH DIFFERENTIAL/PLATELET
Abs Immature Granulocytes: 0.07 10*3/uL (ref 0.00–0.07)
Basophils Absolute: 0 10*3/uL (ref 0.0–0.1)
Basophils Relative: 0 %
Eosinophils Absolute: 0 10*3/uL (ref 0.0–0.5)
Eosinophils Relative: 0 %
HCT: 29.5 % — ABNORMAL LOW (ref 36.0–46.0)
Hemoglobin: 8.8 g/dL — ABNORMAL LOW (ref 12.0–15.0)
Immature Granulocytes: 1 %
Lymphocytes Relative: 6 %
Lymphs Abs: 0.7 10*3/uL (ref 0.7–4.0)
MCH: 28.4 pg (ref 26.0–34.0)
MCHC: 29.8 g/dL — ABNORMAL LOW (ref 30.0–36.0)
MCV: 95.2 fL (ref 80.0–100.0)
Monocytes Absolute: 0.8 10*3/uL (ref 0.1–1.0)
Monocytes Relative: 8 %
Neutro Abs: 9.5 10*3/uL — ABNORMAL HIGH (ref 1.7–7.7)
Neutrophils Relative %: 85 %
Platelets: 303 10*3/uL (ref 150–400)
RBC: 3.1 MIL/uL — ABNORMAL LOW (ref 3.87–5.11)
RDW: 17.7 % — ABNORMAL HIGH (ref 11.5–15.5)
WBC: 11.2 10*3/uL — ABNORMAL HIGH (ref 4.0–10.5)
nRBC: 1.1 % — ABNORMAL HIGH (ref 0.0–0.2)

## 2019-04-27 LAB — PROTIME-INR
INR: 1.1 (ref 0.8–1.2)
Prothrombin Time: 13.8 seconds (ref 11.4–15.2)

## 2019-04-27 LAB — COMPREHENSIVE METABOLIC PANEL
ALT: 25 U/L (ref 0–44)
AST: 38 U/L (ref 15–41)
Albumin: 2.5 g/dL — ABNORMAL LOW (ref 3.5–5.0)
Alkaline Phosphatase: 59 U/L (ref 38–126)
Anion gap: 14 (ref 5–15)
BUN: 56 mg/dL — ABNORMAL HIGH (ref 8–23)
CO2: 40 mmol/L — ABNORMAL HIGH (ref 22–32)
Calcium: 9.4 mg/dL (ref 8.9–10.3)
Chloride: 92 mmol/L — ABNORMAL LOW (ref 98–111)
Creatinine, Ser: 0.77 mg/dL (ref 0.44–1.00)
GFR calc Af Amer: >60 mL/min (ref >60)
GFR calc non Af Amer: >60 mL/min (ref >60)
Glucose, Bld: 132 mg/dL — ABNORMAL HIGH (ref 70–99)
Potassium: 3.7 mmol/L (ref 3.5–5.1)
Sodium: 146 mmol/L — ABNORMAL HIGH (ref 135–145)
Total Bilirubin: 0.6 mg/dL (ref 0.3–1.2)
Total Protein: 6.1 g/dL — ABNORMAL LOW (ref 6.5–8.1)

## 2019-04-27 LAB — HEMOGLOBIN A1C
Hgb A1c MFr Bld: 6.7 % — ABNORMAL HIGH (ref 4.8–5.6)
Mean Plasma Glucose: 145.59 mg/dL

## 2019-04-27 LAB — PHOSPHORUS: Phosphorus: 2.9 mg/dL (ref 2.5–4.6)

## 2019-04-27 LAB — MAGNESIUM: Magnesium: 2 mg/dL (ref 1.7–2.4)

## 2019-04-28 LAB — BASIC METABOLIC PANEL
Anion gap: 9 (ref 5–15)
BUN: 48 mg/dL — ABNORMAL HIGH (ref 8–23)
CO2: 40 mmol/L — ABNORMAL HIGH (ref 22–32)
Calcium: 9.2 mg/dL (ref 8.9–10.3)
Chloride: 95 mmol/L — ABNORMAL LOW (ref 98–111)
Creatinine, Ser: 0.65 mg/dL (ref 0.44–1.00)
GFR calc Af Amer: >60 mL/min (ref >60)
GFR calc non Af Amer: >60 mL/min (ref >60)
Glucose, Bld: 90 mg/dL (ref 70–99)
Potassium: 3 mmol/L — ABNORMAL LOW (ref 3.5–5.1)
Sodium: 144 mmol/L (ref 135–145)

## 2019-04-28 LAB — BLOOD GAS, ARTERIAL
Acid-Base Excess: 16.1 mmol/L — ABNORMAL HIGH (ref 0.0–2.0)
Bicarbonate: 41.7 mmol/L — ABNORMAL HIGH (ref 20.0–28.0)
Delivery systems: POSITIVE
Expiratory PAP: 8
FIO2: 28
Inspiratory PAP: 18
O2 Saturation: 98.6 %
Patient temperature: 98.6
pCO2 arterial: 68.2 mmHg (ref 32.0–48.0)
pH, Arterial: 7.404 (ref 7.350–7.450)
pO2, Arterial: 109 mmHg — ABNORMAL HIGH (ref 83.0–108.0)

## 2019-04-28 LAB — CBC
HCT: 29.8 % — ABNORMAL LOW (ref 36.0–46.0)
Hemoglobin: 8.9 g/dL — ABNORMAL LOW (ref 12.0–15.0)
MCH: 27.8 pg (ref 26.0–34.0)
MCHC: 29.9 g/dL — ABNORMAL LOW (ref 30.0–36.0)
MCV: 93.1 fL (ref 80.0–100.0)
Platelets: 339 10*3/uL (ref 150–400)
RBC: 3.2 MIL/uL — ABNORMAL LOW (ref 3.87–5.11)
RDW: 17.3 % — ABNORMAL HIGH (ref 11.5–15.5)
WBC: 7.7 10*3/uL (ref 4.0–10.5)
nRBC: 2.5 % — ABNORMAL HIGH (ref 0.0–0.2)

## 2019-04-28 LAB — PROCALCITONIN: Procalcitonin: 0.28 ng/mL

## 2019-04-28 LAB — C-REACTIVE PROTEIN: CRP: 8.1 mg/dL — ABNORMAL HIGH (ref <1.0)

## 2019-04-28 MED ORDER — AMIODARONE HCL 200 MG PO TABS
400.00 | ORAL_TABLET | ORAL | Status: DC
Start: 2019-04-27 — End: 2019-04-28

## 2019-04-28 MED ORDER — VENELEX EX OINT
1.00 | TOPICAL_OINTMENT | CUTANEOUS | Status: DC
Start: 2019-04-26 — End: 2019-04-28

## 2019-04-28 MED ORDER — HYDROCODONE-ACETAMINOPHEN 5-325 MG PO TABS
1.00 | ORAL_TABLET | ORAL | Status: DC
Start: ? — End: 2019-04-28

## 2019-04-28 MED ORDER — QUETIAPINE FUMARATE 25 MG PO TABS
12.50 | ORAL_TABLET | ORAL | Status: DC
Start: ? — End: 2019-04-28

## 2019-04-28 MED ORDER — GENERIC EXTERNAL MEDICATION
1.00 | Status: DC
Start: 2019-04-27 — End: 2019-04-28

## 2019-04-28 MED ORDER — GENERIC EXTERNAL MEDICATION
Status: DC
Start: 2019-04-26 — End: 2019-04-28

## 2019-04-28 MED ORDER — ENOXAPARIN SODIUM 40 MG/0.4ML ~~LOC~~ SOLN
40.00 | SUBCUTANEOUS | Status: DC
Start: 2019-04-27 — End: 2019-04-28

## 2019-04-28 MED ORDER — DEXTROSE 10 % IV SOLN
125.00 | INTRAVENOUS | Status: DC
Start: ? — End: 2019-04-28

## 2019-04-28 MED ORDER — GENERIC EXTERNAL MEDICATION
250.00 | Status: DC
Start: 2019-04-26 — End: 2019-04-28

## 2019-04-28 MED ORDER — GENERIC EXTERNAL MEDICATION
50.00 | Status: DC
Start: 2019-04-27 — End: 2019-04-28

## 2019-04-28 MED ORDER — GENERIC EXTERNAL MEDICATION
8.60 | Status: DC
Start: 2019-04-26 — End: 2019-04-28

## 2019-04-28 MED ORDER — ATORVASTATIN CALCIUM 10 MG PO TABS
20.00 | ORAL_TABLET | ORAL | Status: DC
Start: 2019-04-26 — End: 2019-04-28

## 2019-04-28 MED ORDER — MIDODRINE HCL 5 MG PO TABS
15.00 | ORAL_TABLET | ORAL | Status: DC
Start: 2019-04-26 — End: 2019-04-28

## 2019-04-28 MED ORDER — GLUCOSE 40 % PO GEL
15.00 | ORAL | Status: DC
Start: ? — End: 2019-04-28

## 2019-04-28 MED ORDER — IPRATROPIUM-ALBUTEROL 0.5-2.5 (3) MG/3ML IN SOLN
3.00 | RESPIRATORY_TRACT | Status: DC
Start: ? — End: 2019-04-28

## 2019-04-28 MED ORDER — ASPIRIN 81 MG PO CHEW
81.00 | CHEWABLE_TABLET | ORAL | Status: DC
Start: 2019-04-27 — End: 2019-04-28

## 2019-04-28 MED ORDER — COLLAGENASE 250 UNIT/GM EX OINT
TOPICAL_OINTMENT | CUTANEOUS | Status: DC
Start: 2019-04-27 — End: 2019-04-28

## 2019-04-28 MED ORDER — GENERIC EXTERNAL MEDICATION
60.00 | Status: DC
Start: 2019-04-26 — End: 2019-04-28

## 2019-04-28 MED ORDER — INSULIN LISPRO 100 UNIT/ML ~~LOC~~ SOLN
1.00 | SUBCUTANEOUS | Status: DC
Start: 2019-04-26 — End: 2019-04-28

## 2019-04-28 MED ORDER — GENERIC EXTERNAL MEDICATION
Status: DC
Start: 2019-04-27 — End: 2019-04-28

## 2019-04-29 ENCOUNTER — Encounter: Payer: Self-pay | Admitting: Internal Medicine

## 2019-04-29 DIAGNOSIS — I471 Supraventricular tachycardia: Secondary | ICD-10-CM

## 2019-04-29 DIAGNOSIS — J181 Lobar pneumonia, unspecified organism: Secondary | ICD-10-CM | POA: Diagnosis not present

## 2019-04-29 DIAGNOSIS — J90 Pleural effusion, not elsewhere classified: Secondary | ICD-10-CM | POA: Diagnosis present

## 2019-04-29 DIAGNOSIS — J9621 Acute and chronic respiratory failure with hypoxia: Secondary | ICD-10-CM | POA: Diagnosis not present

## 2019-04-29 DIAGNOSIS — I5023 Acute on chronic systolic (congestive) heart failure: Secondary | ICD-10-CM | POA: Diagnosis present

## 2019-04-29 LAB — MAGNESIUM: Magnesium: 2 mg/dL (ref 1.7–2.4)

## 2019-04-29 LAB — BASIC METABOLIC PANEL
Anion gap: 8 (ref 5–15)
BUN: 45 mg/dL — ABNORMAL HIGH (ref 8–23)
CO2: 35 mmol/L — ABNORMAL HIGH (ref 22–32)
Calcium: 9.3 mg/dL (ref 8.9–10.3)
Chloride: 98 mmol/L (ref 98–111)
Creatinine, Ser: 0.64 mg/dL (ref 0.44–1.00)
GFR calc Af Amer: >60 mL/min (ref >60)
GFR calc non Af Amer: >60 mL/min (ref >60)
Glucose, Bld: 89 mg/dL (ref 70–99)
Potassium: 3.9 mmol/L (ref 3.5–5.1)
Sodium: 141 mmol/L (ref 135–145)

## 2019-04-29 LAB — PHOSPHORUS: Phosphorus: 1.9 mg/dL — ABNORMAL LOW (ref 2.5–4.6)

## 2019-04-29 NOTE — Consult Note (Signed)
[facility]  Date of Service: 04/29/2019  PULMONARY CRITICAL CARE CONSULT   KORINNA TAT  ZOX:[id number]  DOB: Dec 30, 1928   DOA: 04/30/2019  Referring Physician: Merton Border, MD  HPI: FREDDI FORSTER is a 85 y.o. female seen for follow up of Acute on Chronic Respiratory Failure.  Patient has multiple medical problems including hypertension hyperlipidemia type 2 diabetes COPD cervical stenosis who was found on the ground when lying around for about 24 hours.  On arrival to the tertiary care center patient was noted to be in supraventricular tachycardia.  Patient was a code stroke was not given TPA because she was outside the window patient had decompensation was intubated and apparently also did suffer a asystolic arrest.  Patient had ACLS protocol with return of circulation.  Patient was also noted at that time to have pneumonia CT angiogram was done negative for pulmonary embolism.  Patient had low blood pressure issues was started on Levophed.  She had another rapid response on the 15th and had increased labored breathing noted at that time.  She is now transferred to our facility for further management and weaning.  Right now she was on nasal cannula off BiPAP.  Review of Systems:  ROS performed and is unremarkable other than noted above.  Past Medical History:  Diagnosis Date  . Allergic rhinitis   . Arthritis    OSTEOARTHRITIS  . Benign essential hypertension   . Complicated bronchitis   . Diabetes mellitus with no complication   . History of breast cancer   . Hyperlipidemia   . Mitral regurgitation   . Onychomycosis   . Pulmonary nodule     Past Surgical History:  Procedure Laterality Date  . BREAST CYST EXCISION Left   . BREAST SURGERY Right    LUMPECTOMY  . INCISIONAL BREAST BIOPSY    . LUNG BIOPSY N/A 01/19/2015   Procedure: LUNG BIOPSY;  Surgeon: Grace Isaac, MD;  Location: [facility];  Service:  Thoracic;  Laterality: N/A;  . TOTAL ABDOMINAL HYSTERECTOMY    . VIDEO BRONCHOSCOPY Bilateral 12/14/2014   Procedure: VIDEO BRONCHOSCOPY WITH FLUORO;  Surgeon: Elsie Stain, MD;  Location: [facility];  Service: Cardiopulmonary;  Laterality: Bilateral;  . VIDEO BRONCHOSCOPY WITH ENDOBRONCHIAL ULTRASOUND N/A 01/19/2015   Procedure: VIDEO BRONCHOSCOPY WITH ENDOBRONCHIAL ULTRASOUND;  Surgeon: Grace Isaac, MD;  Location: [facility];  Service: Thoracic;  Laterality: N/A;    Social History:    reports that she quit smoking about 14 years ago. Her smoking use included cigarettes. She has a 13.50 pack-year smoking history. She has never used smokeless tobacco. She reports that she does not drink alcohol or use drugs.  Family History: Non-Contributory to the present illness  Allergies  Allergen Reactions  . Asa [Aspirin]     Gi upset  . Atorvastatin     GI upset  . Levofloxacin     dizziness    Medications: Reviewed on Rounds  Physical Exam:  Vitals: Temperature 98 blood pressure 97/60 pulse 90 respiratory rate 18 saturations 97%  Ventilator Settings on nasal cannula  . General: Comfortable at this time . Eyes: Grossly normal lids, irises & conjunctiva . ENT: grossly tongue is normal . Neck: no obvious mass . Cardiovascular: S1-S2 normal no gallop or rub . Respiratory: No rhonchi coarse breath sounds noted bilaterally . Abdomen: Soft and nontender . Skin: no rash seen on limited exam . Musculoskeletal: not rigid . Psychiatric:unable to  assess . Neurologic: no seizure no involuntary movements         Labs on Admission:  Basic Metabolic Panel: Recent Labs  Lab 04/27/19 0643 04/28/19 0632 04/29/19 0613  NA 146* 144 141  K 3.7 3.0* 3.9  CL 92* 95* 98  CO2 40* 40* 35*  GLUCOSE 132* 90 89  BUN 56* 48* 45*  CREATININE 0.77 0.65 0.64  CALCIUM 9.4 9.2 9.3  MG 2.0  --  2.0  PHOS 2.9  --  1.9*    Recent Labs  Lab 04/28/2019 1715 04/12/2019 1830 04/28/19 0344  PHART  7.205* 7.326* 7.404  PCO2ART 112* 83.5* 68.2*  PO2ART 220* 81.9* 109*  HCO3 42.7* 42.4* 41.7*  O2SAT 99.2 96.3 98.6    Liver Function Tests: Recent Labs  Lab 04/27/19 0643  AST 38  ALT 25  ALKPHOS 59  BILITOT 0.6  PROT 6.1*  ALBUMIN 2.5*   No results for input(s): LIPASE, AMYLASE in the last 168 hours. No results for input(s): AMMONIA in the last 168 hours.  CBC: Recent Labs  Lab 04/27/19 0643 04/28/19 0632  WBC 11.2* 7.7  NEUTROABS 9.5*  --   HGB 8.8* 8.9*  HCT 29.5* 29.8*  MCV 95.2 93.1  PLT 303 339    Cardiac Enzymes: No results for input(s): CKTOTAL, CKMB, CKMBINDEX, TROPONINI in the last 168 hours.  BNP (last 3 results) No results for input(s): BNP in the last 8760 hours.  ProBNP (last 3 results) No results for input(s): PROBNP in the last 8760 hours.   Radiological Exams on Admission: Dg Chest Port 1 View  Result Date: 04/28/2019 CLINICAL DATA:  Nasogastric tube placement. Respiratory failure. EXAM: PORTABLE CHEST 1 VIEW COMPARISON:  Radiographs 04/01/2019 and 12/31/2018. FINDINGS: 1548 hours. Two views obtained. The left-sided Port-A-Cath is unchanged at the mid SVC level. A feeding tube has been placed, projecting below the diaphragm. The heart size and mediastinal contours are stable. There is a new dependent right pleural effusion associated with increased opacity at the right lung base. This could reflect partial collapse or pneumonia. The left lung is clear. There are degenerative changes within the thoracic spine associated with a convex right scoliosis. Multiple tubes and lines overlie the patient. IMPRESSION: New right pleural effusion and right basilar infiltrate or partial collapse. Radiographic follow-up recommended to exclude underlying mass lesion. Feeding tube projects below the diaphragm, tip visualized on concurrent abdominal radiograph. Electronically Signed   By: Richardean Sale M.D.   On: 05/03/2019 16:10   Dg Abd Portable 1v  Result Date:  04/19/2019 CLINICAL DATA:  Nasogastric tube placement.  Respiratory failure. EXAM: PORTABLE ABDOMEN - 1 VIEW COMPARISON:  AP pelvis 04/01/2019. FINDINGS: 1546 hours. Feeding tube tip is in the right mid abdomen, following the course consistent with location in the distal stomach or proximal duodenum. The bowel gas pattern is nonobstructive. There are no suspicious abdominal calcifications. Moderate convex left lumbar scoliosis noted. IMPRESSION: Feeding tube tip in the distal stomach or proximal duodenum. Electronically Signed   By: Richardean Sale M.D.   On: 04/10/2019 16:07    Assessment/Plan Active Problems:   Acute on chronic respiratory failure with hypoxia (HCC)   Acute on chronic systolic heart failure (HCC)   Supraventricular tachycardia (HCC)   Lobar pneumonia, unspecified organism (HCC)   Pleural effusion   1. Acute on chronic respiratory failure with hypoxia she is doing fairly well right now off the ventilator.  She has been requiring BiPAP will need to monitor her blood gases  monitor oxygenation and adjust the BiPAP accordingly.  It was felt that she likely had pneumonia as well as congestive heart failure which led to her initial decompensation. 2. Chronic systolic heart failure with acute exacerbation patient was treated with diuretics will need to continue to monitor radiologically monitor her fluid status very closely. 3. Supraventricular tachycardia now resolved we will continue monitor on telemetry 4. Lobar pneumonia and pleural effusion status post thoracentesis patient had negative cultures on that.  Also it should be noted that it felt like that she might have right diaphragmatic dysfunction  I have personally seen and evaluated the patient, evaluated laboratory and imaging results, formulated the assessment and plan and placed orders. The Patient requires high complexity decision making for assessment and support.  Case was discussed on Rounds with the Respiratory Therapy  Staff Time Spent 60minutes  Allyne Gee, MD [facility] Pulmonary Critical Care Medicine Sleep Medicine

## 2019-04-30 ENCOUNTER — Other Ambulatory Visit (HOSPITAL_COMMUNITY): Payer: Self-pay

## 2019-04-30 DIAGNOSIS — J181 Lobar pneumonia, unspecified organism: Secondary | ICD-10-CM | POA: Diagnosis not present

## 2019-04-30 DIAGNOSIS — I5023 Acute on chronic systolic (congestive) heart failure: Secondary | ICD-10-CM | POA: Diagnosis not present

## 2019-04-30 DIAGNOSIS — J90 Pleural effusion, not elsewhere classified: Secondary | ICD-10-CM | POA: Diagnosis not present

## 2019-04-30 DIAGNOSIS — J9621 Acute and chronic respiratory failure with hypoxia: Secondary | ICD-10-CM | POA: Diagnosis not present

## 2019-04-30 LAB — BASIC METABOLIC PANEL
Anion gap: 10 (ref 5–15)
BUN: 50 mg/dL — ABNORMAL HIGH (ref 8–23)
CO2: 35 mmol/L — ABNORMAL HIGH (ref 22–32)
Calcium: 9.1 mg/dL (ref 8.9–10.3)
Chloride: 95 mmol/L — ABNORMAL LOW (ref 98–111)
Creatinine, Ser: 0.73 mg/dL (ref 0.44–1.00)
GFR calc Af Amer: >60 mL/min (ref >60)
GFR calc non Af Amer: >60 mL/min (ref >60)
Glucose, Bld: 133 mg/dL — ABNORMAL HIGH (ref 70–99)
Potassium: 3.9 mmol/L (ref 3.5–5.1)
Sodium: 140 mmol/L (ref 135–145)

## 2019-04-30 LAB — CBC
HCT: 27.8 % — ABNORMAL LOW (ref 36.0–46.0)
Hemoglobin: 8.4 g/dL — ABNORMAL LOW (ref 12.0–15.0)
MCH: 27.7 pg (ref 26.0–34.0)
MCHC: 30.2 g/dL (ref 30.0–36.0)
MCV: 91.7 fL (ref 80.0–100.0)
Platelets: 330 10*3/uL (ref 150–400)
RBC: 3.03 MIL/uL — ABNORMAL LOW (ref 3.87–5.11)
RDW: 17.4 % — ABNORMAL HIGH (ref 11.5–15.5)
WBC: 8.1 10*3/uL (ref 4.0–10.5)
nRBC: 2.1 % — ABNORMAL HIGH (ref 0.0–0.2)

## 2019-04-30 LAB — PHOSPHORUS: Phosphorus: 2.7 mg/dL (ref 2.5–4.6)

## 2019-04-30 LAB — MAGNESIUM: Magnesium: 2.1 mg/dL (ref 1.7–2.4)

## 2019-04-30 NOTE — Progress Notes (Signed)
Pulmonary Critical Care Medicine [place]   PULMONARY CRITICAL CARE SERVICE  PROGRESS NOTE  Date of Service: 04/30/2019  Judy Roberts  [id number]  DOB: 07/03/1929   DOA: 05/04/2019  Referring Physician: Merton Border, MD  HPI: Judy Roberts is a 85 y.o. female seen for follow up of Acute on Chronic Respiratory Failure.  Patient is doing well this morning was off BiPAP on nasal cannula good saturations are noted  Medications: Reviewed on Rounds  Physical Exam:  Vitals: Temperature 97.7 pulse 67 respiratory rate 20 blood pressure 92/53 saturations 97%  Ventilator Settings currently patient is off the ventilator  . General: Comfortable at this time . Eyes: Grossly normal lids, irises & conjunctiva . ENT: grossly tongue is normal . Neck: no obvious mass . Cardiovascular: S1 S2 normal no gallop . Respiratory: No rhonchi no rales are noted at this time . Abdomen: soft . Skin: no rash seen on limited exam . Musculoskeletal: not rigid . Psychiatric:unable to assess . Neurologic: no seizure no involuntary movements         Lab Data:   Basic Metabolic Panel: Recent Labs  Lab 04/27/19 0643 04/28/19 0632 04/29/19 0613 04/30/19 0541  NA 146* 144 141 140  K 3.7 3.0* 3.9 3.9  CL 92* 95* 98 95*  CO2 40* 40* 35* 35*  GLUCOSE 132* 90 89 133*  BUN 56* 48* 45* 50*  CREATININE 0.77 0.65 0.64 0.73  CALCIUM 9.4 9.2 9.3 9.1  MG 2.0  --  2.0 2.1  PHOS 2.9  --  1.9* 2.7    ABG: Recent Labs  Lab 04/11/2019 1715 04/20/2019 1830 04/28/19 0344  PHART 7.205* 7.326* 7.404  PCO2ART 112* 83.5* 68.2*  PO2ART 220* 81.9* 109*  HCO3 42.7* 42.4* 41.7*  O2SAT 99.2 96.3 98.6    Liver Function Tests: Recent Labs  Lab 04/27/19 0643  AST 38  ALT 25  ALKPHOS 59  BILITOT 0.6  PROT 6.1*  ALBUMIN 2.5*   No results for input(s): LIPASE, AMYLASE in the last 168 hours. No results for input(s): AMMONIA in the last 168 hours.  CBC: Recent Labs  Lab 04/27/19 0643  04/28/19 0632 04/30/19 0541  WBC 11.2* 7.7 8.1  NEUTROABS 9.5*  --   --   HGB 8.8* 8.9* 8.4*  HCT 29.5* 29.8* 27.8*  MCV 95.2 93.1 91.7  PLT 303 339 330    Cardiac Enzymes: No results for input(s): CKTOTAL, CKMB, CKMBINDEX, TROPONINI in the last 168 hours.  BNP (last 3 results) No results for input(s): BNP in the last 8760 hours.  ProBNP (last 3 results) No results for input(s): PROBNP in the last 8760 hours.  Radiological Exams: Dg Chest Port 1 View  Result Date: 04/30/2019 CLINICAL DATA:  Shortness of breath and weakness EXAM: PORTABLE CHEST 1 VIEW COMPARISON:  Four days ago FINDINGS: Left-sided central line with tip at the SVC or azygos. The feeding tube at least reaches the stomach. Haziness of the bilateral chest, greater on the right where there is layering pleural fluid. No pneumothorax. Normal heart size. Subpleural opacity along the left mid chest is chronic when compared with 2019 chest CT. IMPRESSION: Right pleural effusion which is decreased or further layering when compared to prior. Electronically Signed   By: Monte Fantasia M.D.   On: 04/30/2019 07:25    Assessment/Plan Active Problems:   Acute on chronic respiratory failure with hypoxia (HCC)   Acute on chronic systolic heart failure (HCC)   Supraventricular tachycardia ([place])  Lobar pneumonia, unspecified organism ([place])   Pleural effusion   1. Acute on chronic respiratory failure with hypoxia we will continue with BiPAP at nighttime and stay off during the daytime.  We should be able to hopefully wean the BiPAP on as time goes on. 2. Acute chronic systolic heart failure we will continue with supportive care 3. Supraventricular tachycardia at baseline we will continue to follow 4. Lobar pneumonia treated we will continue with supportive care 5. Pleural effusion follow-up chest x-ray shows improved right pleural effusion   I have personally seen and evaluated the patient, evaluated laboratory and imaging  results, formulated the assessment and plan and placed orders. The Patient requires high complexity decision making for assessment and support.  Case was discussed on Rounds with the Respiratory Therapy Staff  Allyne Gee, MD [facility] Pulmonary Critical Care Medicine Sleep Medicine

## 2019-05-01 DIAGNOSIS — J9621 Acute and chronic respiratory failure with hypoxia: Secondary | ICD-10-CM | POA: Diagnosis not present

## 2019-05-01 DIAGNOSIS — I5023 Acute on chronic systolic (congestive) heart failure: Secondary | ICD-10-CM | POA: Diagnosis not present

## 2019-05-01 DIAGNOSIS — J90 Pleural effusion, not elsewhere classified: Secondary | ICD-10-CM | POA: Diagnosis not present

## 2019-05-01 DIAGNOSIS — J181 Lobar pneumonia, unspecified organism: Secondary | ICD-10-CM | POA: Diagnosis not present

## 2019-05-01 NOTE — Progress Notes (Signed)
Pulmonary Critical Care Medicine [place]   PULMONARY CRITICAL CARE SERVICE  PROGRESS NOTE  Date of Service: 05/01/2019  Judy Roberts  [id number]  DOB: [birth year]   DOA: 04/17/2019  Referring Physician: Merton Border, MD  HPI: Judy Roberts is a 85 y.o. female seen for follow up of Acute on Chronic Respiratory Failure.  Patient currently is on 2 L nasal cannula using the BiPAP at nighttime she is doing fairly well  Medications: Reviewed on Rounds  Physical Exam:  Vitals: Temperature 97.0 pulse 64 respiratory 20 blood pressure 94/64 saturations 100%  Ventilator Settings off the ventilator  . General: Comfortable at this time . Eyes: Grossly normal lids, irises & conjunctiva . ENT: grossly tongue is normal . Neck: no obvious mass . Cardiovascular: S1 S2 normal no gallop . Respiratory: Scattered rhonchi no rales . Abdomen: soft . Skin: no rash seen on limited exam . Musculoskeletal: not rigid . Psychiatric:unable to assess . Neurologic: no seizure no involuntary movements         Lab Data:   Basic Metabolic Panel: Recent Labs  Lab 04/27/19 0643 04/28/19 0632 04/29/19 0613 04/30/19 0541  NA 146* 144 141 140  K 3.7 3.0* 3.9 3.9  CL 92* 95* 98 95*  CO2 40* 40* 35* 35*  GLUCOSE 132* 90 89 133*  BUN 56* 48* 45* 50*  CREATININE 0.77 0.65 0.64 0.73  CALCIUM 9.4 9.2 9.3 9.1  MG 2.0  --  2.0 2.1  PHOS 2.9  --  1.9* 2.7    ABG: Recent Labs  Lab 04/11/2019 1715 04/23/2019 1830 04/28/19 0344  PHART 7.205* 7.326* 7.404  PCO2ART 112* 83.5* 68.2*  PO2ART 220* 81.9* 109*  HCO3 42.7* 42.4* 41.7*  O2SAT 99.2 96.3 98.6    Liver Function Tests: Recent Labs  Lab 04/27/19 0643  AST 38  ALT 25  ALKPHOS 59  BILITOT 0.6  PROT 6.1*  ALBUMIN 2.5*   No results for input(s): LIPASE, AMYLASE in the last 168 hours. No results for input(s): AMMONIA in the last 168 hours.  CBC: Recent Labs  Lab 04/27/19 0643 04/28/19 0632 04/30/19 0541  WBC  11.2* 7.7 8.1  NEUTROABS 9.5*  --   --   HGB 8.8* 8.9* 8.4*  HCT 29.5* 29.8* 27.8*  MCV 95.2 93.1 91.7  PLT 303 339 330    Cardiac Enzymes: No results for input(s): CKTOTAL, CKMB, CKMBINDEX, TROPONINI in the last 168 hours.  BNP (last 3 results) No results for input(s): BNP in the last 8760 hours.  ProBNP (last 3 results) No results for input(s): PROBNP in the last 8760 hours.  Radiological Exams: Dg Chest Port 1 View  Result Date: 04/30/2019 CLINICAL DATA:  Shortness of breath and weakness EXAM: PORTABLE CHEST 1 VIEW COMPARISON:  Four days ago FINDINGS: Left-sided central line with tip at the SVC or azygos. The feeding tube at least reaches the stomach. Haziness of the bilateral chest, greater on the right where there is layering pleural fluid. No pneumothorax. Normal heart size. Subpleural opacity along the left mid chest is chronic when compared with 2019 chest CT. IMPRESSION: Right pleural effusion which is decreased or further layering when compared to prior. Electronically Signed   By: Monte Fantasia M.D.   On: 04/30/2019 07:25    Assessment/Plan Active Problems:   Acute on chronic respiratory failure with hypoxia (HCC)   Acute on chronic systolic heart failure (HCC)   Supraventricular tachycardia (HCC)   Lobar pneumonia, unspecified organism ([place])  Pleural effusion   1. Acute on chronic respiratory failure hypoxia we will continue with 2 L oxygen with Oxymizer during the daytime and use the BiPAP at night. 2. Acute on chronic systolic heart failure compensated we will continue to follow 3. Supraventricular tachycardia rate controlled 4. Lobar pneumonia CT scan results as above 5. Pleural effusion as above reviewed the CT scans   I have personally seen and evaluated the patient, evaluated laboratory and imaging results, formulated the assessment and plan and placed orders. The Patient requires high complexity decision making for assessment and support.  Case was  discussed on Rounds with the Respiratory Therapy Staff  Allyne Gee, MD [facility] Pulmonary Critical Care Medicine Sleep Medicine

## 2019-05-02 ENCOUNTER — Other Ambulatory Visit (HOSPITAL_COMMUNITY): Payer: Self-pay

## 2019-05-02 LAB — BLOOD GAS, ARTERIAL
Acid-Base Excess: 10.4 mmol/L — ABNORMAL HIGH (ref 0.0–2.0)
Acid-Base Excess: 11.8 mmol/L — ABNORMAL HIGH (ref 0.0–2.0)
Bicarbonate: 35.8 mmol/L — ABNORMAL HIGH (ref 20.0–28.0)
Bicarbonate: 35.5 mmol/L — ABNORMAL HIGH (ref 20.0–28.0)
FIO2: 40
FIO2: 40
MECHVT: 400 mL
MECHVT: 400 mL
O2 Saturation: 98.2 %
O2 Saturation: 98.2 %
PEEP: 5 cmH2O
PEEP: 5 cmH2O
Patient temperature: 98.6
Patient temperature: 98.6
RATE: 22 resp/min
RATE: 22 resp/min
pCO2 arterial: 63.1 mmHg — ABNORMAL HIGH (ref 32.0–48.0)
pCO2 arterial: 42.4 mmHg (ref 32.0–48.0)
pH, Arterial: 7.373 (ref 7.350–7.450)
pH, Arterial: 7.533 — ABNORMAL HIGH (ref 7.350–7.450)
pO2, Arterial: 103 mmHg (ref 83.0–108.0)
pO2, Arterial: 82.9 mmHg — ABNORMAL LOW (ref 83.0–108.0)

## 2019-05-02 LAB — COMPREHENSIVE METABOLIC PANEL
ALT: 23 U/L (ref 0–44)
AST: 22 U/L (ref 15–41)
Albumin: 2.3 g/dL — ABNORMAL LOW (ref 3.5–5.0)
Alkaline Phosphatase: 86 U/L (ref 38–126)
Anion gap: 12 (ref 5–15)
BUN: 73 mg/dL — ABNORMAL HIGH (ref 8–23)
CO2: 33 mmol/L — ABNORMAL HIGH (ref 22–32)
Calcium: 8.9 mg/dL (ref 8.9–10.3)
Chloride: 96 mmol/L — ABNORMAL LOW (ref 98–111)
Creatinine, Ser: 1.02 mg/dL — ABNORMAL HIGH (ref 0.44–1.00)
GFR calc Af Amer: 57 mL/min — ABNORMAL LOW (ref >60)
GFR calc non Af Amer: 49 mL/min — ABNORMAL LOW (ref >60)
Glucose, Bld: 178 mg/dL — ABNORMAL HIGH (ref 70–99)
Potassium: 4 mmol/L (ref 3.5–5.1)
Sodium: 141 mmol/L (ref 135–145)
Total Bilirubin: 0.4 mg/dL (ref 0.3–1.2)
Total Protein: 5.5 g/dL — ABNORMAL LOW (ref 6.5–8.1)

## 2019-05-02 LAB — URINALYSIS, ROUTINE W REFLEX MICROSCOPIC
Bilirubin Urine: NEGATIVE
Glucose, UA: NEGATIVE mg/dL
Ketones, ur: NEGATIVE mg/dL
Nitrite: NEGATIVE
Protein, ur: NEGATIVE mg/dL
Specific Gravity, Urine: 1.017 (ref 1.005–1.030)
pH: 5 (ref 5.0–8.0)

## 2019-05-02 LAB — CBC
HCT: 26 % — ABNORMAL LOW (ref 36.0–46.0)
Hemoglobin: 7.7 g/dL — ABNORMAL LOW (ref 12.0–15.0)
MCH: 27.6 pg (ref 26.0–34.0)
MCHC: 29.6 g/dL — ABNORMAL LOW (ref 30.0–36.0)
MCV: 93.2 fL (ref 80.0–100.0)
Platelets: 313 10*3/uL (ref 150–400)
RBC: 2.79 MIL/uL — ABNORMAL LOW (ref 3.87–5.11)
RDW: 17.2 % — ABNORMAL HIGH (ref 11.5–15.5)
WBC: 9.1 10*3/uL (ref 4.0–10.5)
nRBC: 1.7 % — ABNORMAL HIGH (ref 0.0–0.2)

## 2019-05-02 LAB — CK TOTAL AND CKMB (NOT AT ARMC)
CK, MB: 4.1 ng/mL (ref 0.5–5.0)
Relative Index: INVALID (ref 0.0–2.5)
Total CK: 92 U/L (ref 38–234)

## 2019-05-03 ENCOUNTER — Other Ambulatory Visit (HOSPITAL_COMMUNITY): Payer: Self-pay

## 2019-05-03 DIAGNOSIS — J9621 Acute and chronic respiratory failure with hypoxia: Secondary | ICD-10-CM | POA: Diagnosis not present

## 2019-05-03 DIAGNOSIS — J181 Lobar pneumonia, unspecified organism: Secondary | ICD-10-CM | POA: Diagnosis not present

## 2019-05-03 DIAGNOSIS — I5023 Acute on chronic systolic (congestive) heart failure: Secondary | ICD-10-CM | POA: Diagnosis not present

## 2019-05-03 DIAGNOSIS — J90 Pleural effusion, not elsewhere classified: Secondary | ICD-10-CM | POA: Diagnosis not present

## 2019-05-03 LAB — CBC
HCT: 28.1 % — ABNORMAL LOW (ref 36.0–46.0)
Hemoglobin: 8.7 g/dL — ABNORMAL LOW (ref 12.0–15.0)
MCH: 27.8 pg (ref 26.0–34.0)
MCHC: 31 g/dL (ref 30.0–36.0)
MCV: 89.8 fL (ref 80.0–100.0)
Platelets: 301 10*3/uL (ref 150–400)
RBC: 3.13 MIL/uL — ABNORMAL LOW (ref 3.87–5.11)
RDW: 17.5 % — ABNORMAL HIGH (ref 11.5–15.5)
WBC: 11.2 10*3/uL — ABNORMAL HIGH (ref 4.0–10.5)
nRBC: 1.2 % — ABNORMAL HIGH (ref 0.0–0.2)

## 2019-05-03 LAB — RENAL FUNCTION PANEL
Albumin: 2.2 g/dL — ABNORMAL LOW (ref 3.5–5.0)
Anion gap: 12 (ref 5–15)
BUN: 68 mg/dL — ABNORMAL HIGH (ref 8–23)
CO2: 34 mmol/L — ABNORMAL HIGH (ref 22–32)
Calcium: 9 mg/dL (ref 8.9–10.3)
Chloride: 94 mmol/L — ABNORMAL LOW (ref 98–111)
Creatinine, Ser: 0.76 mg/dL (ref 0.44–1.00)
GFR calc Af Amer: >60 mL/min (ref >60)
GFR calc non Af Amer: >60 mL/min (ref >60)
Glucose, Bld: 181 mg/dL — ABNORMAL HIGH (ref 70–99)
Phosphorus: 2.3 mg/dL — ABNORMAL LOW (ref 2.5–4.6)
Potassium: 3.6 mmol/L (ref 3.5–5.1)
Sodium: 140 mmol/L (ref 135–145)

## 2019-05-03 LAB — MAGNESIUM: Magnesium: 2.1 mg/dL (ref 1.7–2.4)

## 2019-05-03 NOTE — Progress Notes (Addendum)
Pulmonary Critical Care Medicine [place]   PULMONARY CRITICAL CARE SERVICE  PROGRESS NOTE  Date of Service: 05/03/2019  Judy Roberts  [id number]  DOB: [birth year]   DOA: 04/16/2019  Referring Physician: Merton Border, MD  HPI: Judy Roberts is a 85 y.o. female seen for follow up of Acute on Chronic Respiratory Failure.  Patient has a 4-hour goal on pressure support 12/5 with 50% FiO2 today.  She was able do this without difficulty.  Has back on full support with 50% FiO2.  Medications: Reviewed on Rounds  Physical Exam:  Vitals: Pulse 68 respirations 18 BP 101/50 O2 sat 100% temp 98.8  Ventilator Settings ventilator mode AC VC rate of 18 tidal volume 400 PEEP of 5 FiO2 of 50%  . General: Comfortable at this time . Eyes: Grossly normal lids, irises & conjunctiva . ENT: grossly tongue is normal . Neck: no obvious mass . Cardiovascular: S1 S2 normal no gallop . Respiratory: Coarse breath sounds . Abdomen: soft . Skin: no rash seen on limited exam . Musculoskeletal: not rigid . Psychiatric:unable to assess . Neurologic: no seizure no involuntary movements         Lab Data:   Basic Metabolic Panel: Recent Labs  Lab 04/27/19 0643 04/28/19 0632 04/29/19 0613 04/30/19 0541 05/02/19 0135 05/03/19 0546  NA 146* 144 141 140 141 140  K 3.7 3.0* 3.9 3.9 4.0 3.6  CL 92* 95* 98 95* 96* 94*  CO2 40* 40* 35* 35* 33* 34*  GLUCOSE 132* 90 89 133* 178* 181*  BUN 56* 48* 45* 50* 73* 68*  CREATININE 0.77 0.65 0.64 0.73 1.02* 0.76  CALCIUM 9.4 9.2 9.3 9.1 8.9 9.0  MG 2.0  --  2.0 2.1  --  2.1  PHOS 2.9  --  1.9* 2.7  --  2.3*    ABG: Recent Labs  Lab 04/10/2019 1715 04/04/2019 1830 04/28/19 0344 05/02/19 0110 05/02/19 0455  PHART 7.205* 7.326* 7.404 7.373 7.533*  PCO2ART 112* 83.5* 68.2* 63.1* 42.4  PO2ART 220* 81.9* 109* 103 82.9*  HCO3 42.7* 42.4* 41.7* 35.8* 35.5*  O2SAT 99.2 96.3 98.6 98.2 98.2    Liver Function Tests: Recent Labs  Lab  04/27/19 0643 05/02/19 0135 05/03/19 0546  AST 38 22  --   ALT 25 23  --   ALKPHOS 59 86  --   BILITOT 0.6 0.4  --   PROT 6.1* 5.5*  --   ALBUMIN 2.5* 2.3* 2.2*   No results for input(s): LIPASE, AMYLASE in the last 168 hours. No results for input(s): AMMONIA in the last 168 hours.  CBC: Recent Labs  Lab 04/27/19 0643 04/28/19 4332 04/30/19 0541 05/02/19 0135 05/03/19 0546  WBC 11.2* 7.7 8.1 9.1 11.2*  NEUTROABS 9.5*  --   --   --   --   HGB 8.8* 8.9* 8.4* 7.7* 8.7*  HCT 29.5* 29.8* 27.8* 26.0* 28.1*  MCV 95.2 93.1 91.7 93.2 89.8  PLT 303 339 330 313 301    Cardiac Enzymes: Recent Labs  Lab 05/02/19 0135  CKTOTAL 92  CKMB 4.1    BNP (last 3 results) No results for input(s): BNP in the last 8760 hours.  ProBNP (last 3 results) No results for input(s): PROBNP in the last 8760 hours.  Radiological Exams: Dg Chest Port 1 View  Result Date: 05/03/2019 CLINICAL DATA:  Respiratory failure EXAM: PORTABLE CHEST 1 VIEW COMPARISON:  05/02/2019 FINDINGS: Moderate right and small left pleural effusions, similar to the prior.  Pleural-based scarring along the lateral left mid lung. No pneumothorax. Endotracheal tube terminates 2 cm above the carina. Feeding tube courses into the stomach. Left chest tube terminates in the lower SVC. The heart is normal in size. IMPRESSION: Endotracheal tube terminates 2 cm above the carina. Additional support apparatus as above. Moderate right and small left pleural effusions, similar to the prior. Electronically Signed   By: Julian Hy M.D.   On: 05/03/2019 06:01   Dg Chest Port 1 View  Result Date: 05/02/2019 CLINICAL DATA:  Status post endotracheal tube placement EXAM: PORTABLE CHEST 1 VIEW COMPARISON:  04/30/2019 FINDINGS: None cardiac shadow is stable. Left chest wall port and feeding catheter are again seen and stable. New endotracheal tube is noted in satisfactory position. Lungs are well aerated bilaterally with the exception of  right basilar consolidation increased from exam. Minimal scarring in the left lung is noted. IMPRESSION: Endotracheal tube in satisfactory position. Increasing consolidation in the right base. Electronically Signed   By: Inez Catalina M.D.   On: 05/02/2019 01:26    Assessment/Plan Active Problems:   Acute on chronic respiratory failure with hypoxia (HCC)   Acute on chronic systolic heart failure (HCC)   Supraventricular tachycardia (HCC)   Lobar pneumonia, unspecified organism (HCC)   Pleural effusion   1. Acute on chronic respiratory failure with hypoxia continue with ventilator on full support and continue weaning per protocol.  Patient tolerated 4 hours on pressure support today.  Continue support and pulmonary toilet 2. Acute on a systolic heart failure compensated continue to follow 3. Supraventricular tachycardia rate controlled 4. Lobar pneumonia continue present therapy 5. Pleural effusion as above continue with CT scans   I have personally seen and evaluated the patient, evaluated laboratory and imaging results, formulated the assessment and plan and placed orders. The Patient requires high complexity decision making for assessment and support.  Case was discussed on Rounds with the Respiratory Therapy Staff  Allyne Gee, MD [facility] Pulmonary Critical Care Medicine Sleep Medicine

## 2019-05-04 ENCOUNTER — Other Ambulatory Visit (HOSPITAL_COMMUNITY): Payer: Self-pay

## 2019-05-04 DIAGNOSIS — J181 Lobar pneumonia, unspecified organism: Secondary | ICD-10-CM | POA: Diagnosis not present

## 2019-05-04 DIAGNOSIS — J90 Pleural effusion, not elsewhere classified: Secondary | ICD-10-CM | POA: Diagnosis not present

## 2019-05-04 DIAGNOSIS — I5023 Acute on chronic systolic (congestive) heart failure: Secondary | ICD-10-CM | POA: Diagnosis not present

## 2019-05-04 DIAGNOSIS — J9621 Acute and chronic respiratory failure with hypoxia: Secondary | ICD-10-CM | POA: Diagnosis not present

## 2019-05-04 LAB — BLOOD GAS, ARTERIAL
Acid-Base Excess: 15.6 mmol/L — ABNORMAL HIGH (ref 0.0–2.0)
Bicarbonate: 40.1 mmol/L — ABNORMAL HIGH (ref 20.0–28.0)
FIO2: 0.4
MECHVT: 400 mL
O2 Saturation: 99.3 %
PEEP: 5 cmH2O
Patient temperature: 98.6
RATE: 18 resp/min
pCO2 arterial: 50.5 mmHg — ABNORMAL HIGH (ref 32.0–48.0)
pH, Arterial: 7.51 — ABNORMAL HIGH (ref 7.350–7.450)
pO2, Arterial: 130 mmHg — ABNORMAL HIGH (ref 83.0–108.0)

## 2019-05-04 LAB — RENAL FUNCTION PANEL
Albumin: 1.9 g/dL — ABNORMAL LOW (ref 3.5–5.0)
Anion gap: 12 (ref 5–15)
BUN: 64 mg/dL — ABNORMAL HIGH (ref 8–23)
CO2: 35 mmol/L — ABNORMAL HIGH (ref 22–32)
Calcium: 8.7 mg/dL — ABNORMAL LOW (ref 8.9–10.3)
Chloride: 95 mmol/L — ABNORMAL LOW (ref 98–111)
Creatinine, Ser: 0.73 mg/dL (ref 0.44–1.00)
GFR calc Af Amer: >60 mL/min (ref >60)
GFR calc non Af Amer: >60 mL/min (ref >60)
Glucose, Bld: 166 mg/dL — ABNORMAL HIGH (ref 70–99)
Phosphorus: 2.5 mg/dL (ref 2.5–4.6)
Potassium: 3.3 mmol/L — ABNORMAL LOW (ref 3.5–5.1)
Sodium: 142 mmol/L (ref 135–145)

## 2019-05-04 LAB — CBC
HCT: 25.6 % — ABNORMAL LOW (ref 36.0–46.0)
Hemoglobin: 7.9 g/dL — ABNORMAL LOW (ref 12.0–15.0)
MCH: 27.6 pg (ref 26.0–34.0)
MCHC: 30.9 g/dL (ref 30.0–36.0)
MCV: 89.5 fL (ref 80.0–100.0)
Platelets: 258 10*3/uL (ref 150–400)
RBC: 2.86 MIL/uL — ABNORMAL LOW (ref 3.87–5.11)
RDW: 17.5 % — ABNORMAL HIGH (ref 11.5–15.5)
WBC: 12 10*3/uL — ABNORMAL HIGH (ref 4.0–10.5)
nRBC: 0.6 % — ABNORMAL HIGH (ref 0.0–0.2)

## 2019-05-04 LAB — URINE CULTURE: Culture: 70000 — AB

## 2019-05-04 LAB — MAGNESIUM: Magnesium: 2 mg/dL (ref 1.7–2.4)

## 2019-05-04 NOTE — Progress Notes (Signed)
Pulmonary Critical Care Medicine [place]   PULMONARY CRITICAL CARE SERVICE  PROGRESS NOTE  Date of Service: 05/04/2019  Judy Roberts  [id number]  DOB: 10-03-1929   DOA: 05/03/2019  Referring Physician: Merton Border, MD  HPI: Judy Roberts is a 85 y.o. female seen for follow up of Acute on Chronic Respiratory Failure.  At this time patient is on pressure support mode has been on 40% FiO2 she looks about the same currently is on 12/5  Medications: Reviewed on Rounds  Physical Exam:  Vitals: Temperature 99.6 pulse 60 respiratory 19 blood pressure 104/54 saturations 100%  Ventilator Settings mode ventilation pressure support FiO2 40% tidal volume 292 pressure poor 12 PEEP 5  . General: Comfortable at this time . Eyes: Grossly normal lids, irises & conjunctiva . ENT: grossly tongue is normal . Neck: no obvious mass . Cardiovascular: S1 S2 normal no gallop . Respiratory: No rhonchi or rales are noted at this time . Abdomen: soft . Skin: no rash seen on limited exam . Musculoskeletal: not rigid . Psychiatric:unable to assess . Neurologic: no seizure no involuntary movements         Lab Data:   Basic Metabolic Panel: Recent Labs  Lab 04/29/19 0613 04/30/19 0541 05/02/19 0135 05/03/19 0546 05/04/19 1001  NA 141 140 141 140 142  K 3.9 3.9 4.0 3.6 3.3*  CL 98 95* 96* 94* 95*  CO2 35* 35* 33* 34* 35*  GLUCOSE 89 133* 178* 181* 166*  BUN 45* 50* 73* 68* 64*  CREATININE 0.64 0.73 1.02* 0.76 0.73  CALCIUM 9.3 9.1 8.9 9.0 8.7*  MG 2.0 2.1  --  2.1 2.0  PHOS 1.9* 2.7  --  2.3* 2.5    ABG: Recent Labs  Lab 04/28/19 0344 05/02/19 0110 05/02/19 0455 05/04/19 0555  PHART 7.404 7.373 7.533* 7.510*  PCO2ART 68.2* 63.1* 42.4 50.5*  PO2ART 109* 103 82.9* 130*  HCO3 41.7* 35.8* 35.5* 40.1*  O2SAT 98.6 98.2 98.2 99.3    Liver Function Tests: Recent Labs  Lab 05/02/19 0135 05/03/19 0546 05/04/19 1001  AST 22  --   --   ALT 23  --   --    ALKPHOS 86  --   --   BILITOT 0.4  --   --   PROT 5.5*  --   --   ALBUMIN 2.3* 2.2* 1.9*   No results for input(s): LIPASE, AMYLASE in the last 168 hours. No results for input(s): AMMONIA in the last 168 hours.  CBC: Recent Labs  Lab 04/28/19 0632 04/30/19 0541 05/02/19 0135 05/03/19 0546 05/04/19 1001  WBC 7.7 8.1 9.1 11.2* 12.0*  HGB 8.9* 8.4* 7.7* 8.7* 7.9*  HCT 29.8* 27.8* 26.0* 28.1* 25.6*  MCV 93.1 91.7 93.2 89.8 89.5  PLT 339 330 313 301 258    Cardiac Enzymes: Recent Labs  Lab 05/02/19 0135  CKTOTAL 92  CKMB 4.1    BNP (last 3 results) No results for input(s): BNP in the last 8760 hours.  ProBNP (last 3 results) No results for input(s): PROBNP in the last 8760 hours.  Radiological Exams: Dg Chest Port 1 View  Result Date: 05/04/2019 CLINICAL DATA:  Acute on chronic respiratory failure. EXAM: PORTABLE CHEST 1 VIEW COMPARISON:  05/03/2019 FINDINGS: Endotracheal tube tip is 1 cm above the carina. Soft feeding tube enters the abdomen. Left subclavian power port tip in the SVC above the right atrium. Bilateral mid and lower lung infiltrate and atelectasis persists, similar to yesterday.  No worsening or new finding IMPRESSION: Lines and tubes well positioned. Persistent mid and lower lung infiltrate and volume loss without worsening or new finding. Electronically Signed   By: Nelson Chimes M.D.   On: 05/04/2019 06:56   Dg Chest Port 1 View  Result Date: 05/03/2019 CLINICAL DATA:  Respiratory failure EXAM: PORTABLE CHEST 1 VIEW COMPARISON:  05/02/2019 FINDINGS: Moderate right and small left pleural effusions, similar to the prior. Pleural-based scarring along the lateral left mid lung. No pneumothorax. Endotracheal tube terminates 2 cm above the carina. Feeding tube courses into the stomach. Left chest tube terminates in the lower SVC. The heart is normal in size. IMPRESSION: Endotracheal tube terminates 2 cm above the carina. Additional support apparatus as above.  Moderate right and small left pleural effusions, similar to the prior. Electronically Signed   By: Julian Hy M.D.   On: 05/03/2019 06:01    Assessment/Plan Active Problems:   Acute on chronic respiratory failure with hypoxia (HCC)   Acute on chronic systolic heart failure (HCC)   Supraventricular tachycardia (HCC)   Lobar pneumonia, unspecified organism (HCC)   Pleural effusion   1. Acute on chronic respiratory failure with hypoxia we will continue with full support on pressure support.  Patient's FiO2 requirements are still 40% good saturations try to wean FiO2 down if possible.  The patient is likely going to need a trach we will need to discuss further with the family. 2. Acute on chronic systolic heart failure diuretics as tolerated. 3. Supraventricular tachycardia right now rate controlled 4. Lobar pneumonia unchanged we will continue with present management 5. Pleural effusion as noted above on the chest film has small effusion on the left and moderate on the right   I have personally seen and evaluated the patient, evaluated laboratory and imaging results, formulated the assessment and plan and placed orders. The Patient requires high complexity decision making for assessment and support.  Case was discussed on Rounds with the Respiratory Therapy Staff  Allyne Gee, MD [facility] Pulmonary Critical Care Medicine Sleep Medicine

## 2019-05-05 ENCOUNTER — Other Ambulatory Visit (HOSPITAL_COMMUNITY): Payer: Self-pay

## 2019-05-05 DIAGNOSIS — J9621 Acute and chronic respiratory failure with hypoxia: Secondary | ICD-10-CM | POA: Diagnosis not present

## 2019-05-05 DIAGNOSIS — J90 Pleural effusion, not elsewhere classified: Secondary | ICD-10-CM | POA: Diagnosis not present

## 2019-05-05 DIAGNOSIS — I5023 Acute on chronic systolic (congestive) heart failure: Secondary | ICD-10-CM | POA: Diagnosis not present

## 2019-05-05 DIAGNOSIS — J181 Lobar pneumonia, unspecified organism: Secondary | ICD-10-CM | POA: Diagnosis not present

## 2019-05-05 LAB — CULTURE, RESPIRATORY W GRAM STAIN

## 2019-05-05 LAB — CBC
HCT: 25.5 % — ABNORMAL LOW (ref 36.0–46.0)
Hemoglobin: 7.8 g/dL — ABNORMAL LOW (ref 12.0–15.0)
MCH: 27.4 pg (ref 26.0–34.0)
MCHC: 30.6 g/dL (ref 30.0–36.0)
MCV: 89.5 fL (ref 80.0–100.0)
Platelets: 258 10*3/uL (ref 150–400)
RBC: 2.85 MIL/uL — ABNORMAL LOW (ref 3.87–5.11)
RDW: 17.4 % — ABNORMAL HIGH (ref 11.5–15.5)
WBC: 12.2 10*3/uL — ABNORMAL HIGH (ref 4.0–10.5)
nRBC: 0.4 % — ABNORMAL HIGH (ref 0.0–0.2)

## 2019-05-05 LAB — RENAL FUNCTION PANEL
Albumin: 1.9 g/dL — ABNORMAL LOW (ref 3.5–5.0)
Anion gap: 12 (ref 5–15)
BUN: 64 mg/dL — ABNORMAL HIGH (ref 8–23)
CO2: 35 mmol/L — ABNORMAL HIGH (ref 22–32)
Calcium: 8.6 mg/dL — ABNORMAL LOW (ref 8.9–10.3)
Chloride: 91 mmol/L — ABNORMAL LOW (ref 98–111)
Creatinine, Ser: 0.72 mg/dL (ref 0.44–1.00)
GFR calc Af Amer: >60 mL/min (ref >60)
GFR calc non Af Amer: >60 mL/min (ref >60)
Glucose, Bld: 138 mg/dL — ABNORMAL HIGH (ref 70–99)
Phosphorus: 2.3 mg/dL — ABNORMAL LOW (ref 2.5–4.6)
Potassium: 3.7 mmol/L (ref 3.5–5.1)
Sodium: 138 mmol/L (ref 135–145)

## 2019-05-05 LAB — MAGNESIUM: Magnesium: 2 mg/dL (ref 1.7–2.4)

## 2019-05-05 NOTE — Progress Notes (Addendum)
Pulmonary Critical Care Medicine [place]   PULMONARY CRITICAL CARE SERVICE  PROGRESS NOTE  Date of Service: 05/05/2019  Judy Roberts  [id number]  DOB: [birth year]   DOA: 04/07/2019  Referring Physician: Merton Border, MD  HPI: Judy Roberts is a 85 y.o. female seen for follow up of Acute on Chronic Respiratory Failure.  Patient had a 12-hour old today on pressure support 12/5 FiO2 40%.  Currently doing well at this and satting 100%.  No distress or fever noted.  Medications: Reviewed on Rounds  Physical Exam:  Vitals: Pulse 69 respirations 19 BP 101/58 O2 sat 100% temp 98.9  Ventilator Settings pressure support 12/5 FiO2 of 40%  . General: Comfortable at this time . Eyes: Grossly normal lids, irises & conjunctiva . ENT: grossly tongue is normal . Neck: no obvious mass . Cardiovascular: S1 S2 normal no gallop . Respiratory: No rales or rhonchi noted . Abdomen: soft . Skin: no rash seen on limited exam . Musculoskeletal: not rigid . Psychiatric:unable to assess . Neurologic: no seizure no involuntary movements         Lab Data:   Basic Metabolic Panel: Recent Labs  Lab 04/29/19 0613 04/30/19 0541 05/02/19 0135 05/03/19 0546 05/04/19 1001 05/05/19 0701  NA 141 140 141 140 142 138  K 3.9 3.9 4.0 3.6 3.3* 3.7  CL 98 95* 96* 94* 95* 91*  CO2 35* 35* 33* 34* 35* 35*  GLUCOSE 89 133* 178* 181* 166* 138*  BUN 45* 50* 73* 68* 64* 64*  CREATININE 0.64 0.73 1.02* 0.76 0.73 0.72  CALCIUM 9.3 9.1 8.9 9.0 8.7* 8.6*  MG 2.0 2.1  --  2.1 2.0 2.0  PHOS 1.9* 2.7  --  2.3* 2.5 2.3*    ABG: Recent Labs  Lab 05/02/19 0110 05/02/19 0455 05/04/19 0555  PHART 7.373 7.533* 7.510*  PCO2ART 63.1* 42.4 50.5*  PO2ART 103 82.9* 130*  HCO3 35.8* 35.5* 40.1*  O2SAT 98.2 98.2 99.3    Liver Function Tests: Recent Labs  Lab 05/02/19 0135 05/03/19 0546 05/04/19 1001 05/05/19 0701  AST 22  --   --   --   ALT 23  --   --   --   ALKPHOS 86  --   --   --    BILITOT 0.4  --   --   --   PROT 5.5*  --   --   --   ALBUMIN 2.3* 2.2* 1.9* 1.9*   No results for input(s): LIPASE, AMYLASE in the last 168 hours. No results for input(s): AMMONIA in the last 168 hours.  CBC: Recent Labs  Lab 04/30/19 0541 05/02/19 0135 05/03/19 0546 05/04/19 1001 05/05/19 0701  WBC 8.1 9.1 11.2* 12.0* 12.2*  HGB 8.4* 7.7* 8.7* 7.9* 7.8*  HCT 27.8* 26.0* 28.1* 25.6* 25.5*  MCV 91.7 93.2 89.8 89.5 89.5  PLT 330 313 301 258 258    Cardiac Enzymes: Recent Labs  Lab 05/02/19 0135  CKTOTAL 92  CKMB 4.1    BNP (last 3 results) No results for input(s): BNP in the last 8760 hours.  ProBNP (last 3 results) No results for input(s): PROBNP in the last 8760 hours.  Radiological Exams: Dg Chest Port 1 View  Result Date: 05/05/2019 CLINICAL DATA:  85 year old female with respiratory failure. EXAM: PORTABLE CHEST 1 VIEW COMPARISON:  05/04/2019 and earlier. FINDINGS: Portable AP upright view at 0741 hours. Endotracheal tube tip near the carina today, about 10 millimeters above. Enteric feeding tube courses  to the abdomen, tip not included. Stable left chest power port. Stable lung volumes and ventilation with chronic attenuation of upper lobe markings and chronic mid lung scarring/distortion. No pneumothorax, pulmonary edema, pleural effusion or acute opacity. Negative visible bowel gas pattern. Stable cardiac size and mediastinal contours. IMPRESSION: 1. ETT tip about 10 mm above the carina. Retract 1 cm for more optimal placement. 2. Stable visible enteric tube. No acute cardiopulmonary abnormality. Electronically Signed   By: Genevie Ann M.D.   On: 05/05/2019 07:51   Dg Chest Port 1 View  Result Date: 05/04/2019 CLINICAL DATA:  Acute on chronic respiratory failure. EXAM: PORTABLE CHEST 1 VIEW COMPARISON:  05/03/2019 FINDINGS: Endotracheal tube tip is 1 cm above the carina. Soft feeding tube enters the abdomen. Left subclavian power port tip in the SVC above the right  atrium. Bilateral mid and lower lung infiltrate and atelectasis persists, similar to yesterday. No worsening or new finding IMPRESSION: Lines and tubes well positioned. Persistent mid and lower lung infiltrate and volume loss without worsening or new finding. Electronically Signed   By: Nelson Chimes M.D.   On: 05/04/2019 06:56    Assessment/Plan Active Problems:   Acute on chronic respiratory failure with hypoxia (HCC)   Acute on chronic systolic heart failure (HCC)   Supraventricular tachycardia (HCC)   Lobar pneumonia, unspecified organism (HCC)   Pleural effusion   1. Acute on chronic respiratory failure with hypoxia we will continue with full support on pressure support.  Patient's FiO2 requirements are still 40% good saturations try to wean FiO2 down if possible.  Patient has a 12-hour [place] pressure support at this time.  Currently doing well.  Continue supportive measures. 2. Acute on chronic systolic heart failure diuretics as tolerated. 3. Supraventricular tachycardia right now rate controlled 4. Lobar pneumonia unchanged we will continue with present management 5. Pleural effusion as noted above on the chest film has small effusion on the left and moderate on the right   I have personally seen and evaluated the patient, evaluated laboratory and imaging results, formulated the assessment and plan and placed orders. The Patient requires high complexity decision making for assessment and support.  Case was discussed on Rounds with the Respiratory Therapy Staff  Allyne Gee, MD [facility] Pulmonary Critical Care Medicine Sleep Medicine

## 2019-05-05 DEATH — deceased

## 2019-05-06 DIAGNOSIS — J9621 Acute and chronic respiratory failure with hypoxia: Secondary | ICD-10-CM | POA: Diagnosis not present

## 2019-05-06 DIAGNOSIS — J181 Lobar pneumonia, unspecified organism: Secondary | ICD-10-CM | POA: Diagnosis not present

## 2019-05-06 DIAGNOSIS — I5023 Acute on chronic systolic (congestive) heart failure: Secondary | ICD-10-CM | POA: Diagnosis not present

## 2019-05-06 DIAGNOSIS — J90 Pleural effusion, not elsewhere classified: Secondary | ICD-10-CM | POA: Diagnosis not present

## 2019-05-06 NOTE — Progress Notes (Addendum)
Pulmonary Critical Care Medicine [employer]   PULMONARY CRITICAL CARE SERVICE  PROGRESS NOTE  Date of Service: 05/06/2019  Judy Roberts  [id number]:[id number]  DOB: [birth year]   DOA: 04/28/2019  Referring Physician: Merton Border, MD  HPI: Judy Roberts is a 85 y.o. female seen for follow up of Acute on Chronic Respiratory Failure.  Patient remains on pressure support with a 16-hour goal today 30% FiO2.  Satting well with no distress at this time.  Medications: Reviewed on Rounds  Physical Exam:  Vitals: Pulse 71 respirations 18 BP 93/56 O2 sat 100% temp 97.3  Ventilator Settings per support 12/5 FiO2 of 30%  . General: Comfortable at this time . Eyes: Grossly normal lids, irises & conjunctiva . ENT: grossly tongue is normal . Neck: no obvious mass . Cardiovascular: S1 S2 normal no gallop . Respiratory: No rales or rhonchi noted . Abdomen: soft . Skin: no rash seen on limited exam . Musculoskeletal: not rigid . Psychiatric:unable to assess . Neurologic: no seizure no involuntary movements         Lab Data:   Basic Metabolic Panel: Recent Labs  Lab 04/30/19 0541 05/02/19 0135 05/03/19 0546 05/04/19 1001 05/05/19 0701  NA 140 141 140 142 138  K 3.9 4.0 3.6 3.3* 3.7  CL 95* 96* 94* 95* 91*  CO2 35* 33* 34* 35* 35*  GLUCOSE 133* 178* 181* 166* 138*  BUN 50* 73* 68* 64* 64*  CREATININE 0.73 1.02* 0.76 0.73 0.72  CALCIUM 9.1 8.9 9.0 8.7* 8.6*  MG 2.1  --  2.1 2.0 2.0  PHOS 2.7  --  2.3* 2.5 2.3*    ABG: Recent Labs  Lab 05/02/19 0110 05/02/19 0455 05/04/19 0555  PHART 7.373 7.533* 7.510*  PCO2ART 63.1* 42.4 50.5*  PO2ART 103 82.9* 130*  HCO3 35.8* 35.5* 40.1*  O2SAT 98.2 98.2 99.3    Liver Function Tests: Recent Labs  Lab 05/02/19 0135 05/03/19 0546 05/04/19 1001 05/05/19 0701  AST 22  --   --   --   ALT 23  --   --   --   ALKPHOS 86  --   --   --   BILITOT 0.4  --   --   --   PROT 5.5*  --   --   --   ALBUMIN 2.3* 2.2* 1.9* 1.9*    No results for input(s): LIPASE, AMYLASE in the last 168 hours. No results for input(s): AMMONIA in the last 168 hours.  CBC: Recent Labs  Lab 04/30/19 0541 05/02/19 0135 05/03/19 0546 05/04/19 1001 05/05/19 0701  WBC 8.1 9.1 11.2* 12.0* 12.2*  HGB 8.4* 7.7* 8.7* 7.9* 7.8*  HCT 27.8* 26.0* 28.1* 25.6* 25.5*  MCV 91.7 93.2 89.8 89.5 89.5  PLT 330 313 301 258 258    Cardiac Enzymes: Recent Labs  Lab 05/02/19 0135  CKTOTAL 92  CKMB 4.1    BNP (last 3 results) No results for input(s): BNP in the last 8760 hours.  ProBNP (last 3 results) No results for input(s): PROBNP in the last 8760 hours.  Radiological Exams: Dg Chest Port 1 View  Result Date: 05/05/2019 CLINICAL DATA:  85 year old female with respiratory failure. EXAM: PORTABLE CHEST 1 VIEW COMPARISON:  05/04/2019 and earlier. FINDINGS: Portable AP upright view at 0741 hours. Endotracheal tube tip near the carina today, about 10 millimeters above. Enteric feeding tube courses to the abdomen, tip not included. Stable left chest power port. Stable lung volumes and ventilation with chronic  attenuation of upper lobe markings and chronic mid lung scarring/distortion. No pneumothorax, pulmonary edema, pleural effusion or acute opacity. Negative visible bowel gas pattern. Stable cardiac size and mediastinal contours. IMPRESSION: 1. ETT tip about 10 mm above the carina. Retract 1 cm for more optimal placement. 2. Stable visible enteric tube. No acute cardiopulmonary abnormality. Electronically Signed   By: Genevie Ann M.D.   On: 05/05/2019 07:51    Assessment/Plan Active Problems:   Acute on chronic respiratory failure with hypoxia (HCC)   Acute on chronic systolic heart failure (HCC)   Supraventricular tachycardia (HCC)   Lobar pneumonia, unspecified organism (HCC)   Pleural effusion   1. Acute on chronic respiratory failure with hypoxia we will continue with full support on pressure support. Patient's FiO2 requirements are  still 30% good saturations try to wean FiO2 down if possible.  Patient has a 16-hour [place] pressure support at this time.  Currently doing well.  Continue supportive measures. 2. Acute on chronic systolic heart failure diuretics as tolerated. 3. Supraventricular tachycardia right now rate controlled 4. Lobar pneumonia unchanged we will continue with present management 5. Pleural effusion as noted above on the chest film has small effusion on the left and moderate on the right side patient's the next day or so ranges just   I have personally seen and evaluated the patient, evaluated laboratory and imaging results, formulated the assessment and plan and placed orders. The Patient requires high complexity decision making for assessment and support.  Case was discussed on Rounds with the Respiratory Therapy Staff  Allyne Gee, MD [facility] Pulmonary Critical Care Medicine Sleep Medicine

## 2019-05-07 DIAGNOSIS — J9621 Acute and chronic respiratory failure with hypoxia: Secondary | ICD-10-CM | POA: Diagnosis not present

## 2019-05-07 DIAGNOSIS — J90 Pleural effusion, not elsewhere classified: Secondary | ICD-10-CM | POA: Diagnosis not present

## 2019-05-07 DIAGNOSIS — J181 Lobar pneumonia, unspecified organism: Secondary | ICD-10-CM | POA: Diagnosis not present

## 2019-05-07 DIAGNOSIS — I5023 Acute on chronic systolic (congestive) heart failure: Secondary | ICD-10-CM | POA: Diagnosis not present

## 2019-05-07 LAB — VANCOMYCIN, TROUGH: Vancomycin Tr: 14 ug/mL — ABNORMAL LOW (ref 15–20)

## 2019-05-07 NOTE — Progress Notes (Addendum)
Pulmonary Critical Care Medicine [place]   PULMONARY CRITICAL CARE SERVICE  PROGRESS NOTE  Date of Service: 05/07/2019  Judy Roberts  UXL:[id number]  DOB: Aug 26, 1929   DOA: 04/12/2019  Referring Physician: Merton Border, MD  HPI: Judy Roberts is a 85 y.o. female seen for follow up of Acute on Chronic Respiratory Failure.  Patient has a 20-hour goal today on pressure support 12/5 with an FiO2 of 30%.  Medications: Reviewed on Rounds  Physical Exam:  Vitals: Pulse 70 respirations 18 BP 143/70 O2 sat 100% temp 96.7  Ventilator Settings pressure support 12/5 FiO2 of 30%  . General: Comfortable at this time . Eyes: Grossly normal lids, irises & conjunctiva . ENT: grossly tongue is normal . Neck: no obvious mass . Cardiovascular: S1 S2 normal no gallop . Respiratory: No rales or rhonchi noted . Abdomen: soft . Skin: no rash seen on limited exam . Musculoskeletal: not rigid . Psychiatric:unable to assess . Neurologic: no seizure no involuntary movements         Lab Data:   Basic Metabolic Panel: Recent Labs  Lab 05/02/19 0135 05/03/19 0546 05/04/19 1001 05/05/19 0701  NA 141 140 142 138  K 4.0 3.6 3.3* 3.7  CL 96* 94* 95* 91*  CO2 33* 34* 35* 35*  GLUCOSE 178* 181* 166* 138*  BUN 73* 68* 64* 64*  CREATININE 1.02* 0.76 0.73 0.72  CALCIUM 8.9 9.0 8.7* 8.6*  MG  --  2.1 2.0 2.0  PHOS  --  2.3* 2.5 2.3*    ABG: Recent Labs  Lab 05/02/19 0110 05/02/19 0455 05/04/19 0555  PHART 7.373 7.533* 7.510*  PCO2ART 63.1* 42.4 50.5*  PO2ART 103 82.9* 130*  HCO3 35.8* 35.5* 40.1*  O2SAT 98.2 98.2 99.3    Liver Function Tests: Recent Labs  Lab 05/02/19 0135 05/03/19 0546 05/04/19 1001 05/05/19 0701  AST 22  --   --   --   ALT 23  --   --   --   ALKPHOS 86  --   --   --   BILITOT 0.4  --   --   --   PROT 5.5*  --   --   --   ALBUMIN 2.3* 2.2* 1.9* 1.9*   No results for input(s): LIPASE, AMYLASE in the last 168 hours. No results for  input(s): AMMONIA in the last 168 hours.  CBC: Recent Labs  Lab 05/02/19 0135 05/03/19 0546 05/04/19 1001 05/05/19 0701  WBC 9.1 11.2* 12.0* 12.2*  HGB 7.7* 8.7* 7.9* 7.8*  HCT 26.0* 28.1* 25.6* 25.5*  MCV 93.2 89.8 89.5 89.5  PLT 313 301 258 258    Cardiac Enzymes: Recent Labs  Lab 05/02/19 0135  CKTOTAL 92  CKMB 4.1    BNP (last 3 results) No results for input(s): BNP in the last 8760 hours.  ProBNP (last 3 results) No results for input(s): PROBNP in the last 8760 hours.  Radiological Exams: No results found.  Assessment/Plan Active Problems:   Acute on chronic respiratory failure with hypoxia (HCC)   Acute on chronic systolic heart failure (HCC)   Supraventricular tachycardia (HCC)   Lobar pneumonia, unspecified organism (HCC)   Pleural effusion   1. Acute on chronic respiratory failure with hypoxia we will continue with full support on pressure support. Patient's FiO2 requirements are still 30% good saturations try to wean FiO2 down if possible.Patient has a 20-hour Goal on pressure support at this time. Currently doing well. Continue supportive measures. 2.  Acute on chronic systolic heart failure diuretics as tolerated. 3. Supraventricular tachycardia right now rate controlled 4. Lobar pneumonia unchanged we will continue with present management 5. Pleural effusion as noted above on the chest film has small effusion on the left and moderate on the right side patient's the next day or so ranges just   I have personally seen and evaluated the patient, evaluated laboratory and imaging results, formulated the assessment and plan and placed orders. The Patient requires high complexity decision making for assessment and support.  Case was discussed on Rounds with the Respiratory Therapy Staff  Allyne Gee, MD [facility] Pulmonary Critical Care Medicine Sleep Medicine

## 2019-05-08 ENCOUNTER — Other Ambulatory Visit (HOSPITAL_COMMUNITY): Payer: Self-pay

## 2019-05-08 DIAGNOSIS — J9621 Acute and chronic respiratory failure with hypoxia: Secondary | ICD-10-CM | POA: Diagnosis not present

## 2019-05-08 DIAGNOSIS — I5023 Acute on chronic systolic (congestive) heart failure: Secondary | ICD-10-CM | POA: Diagnosis not present

## 2019-05-08 DIAGNOSIS — J181 Lobar pneumonia, unspecified organism: Secondary | ICD-10-CM | POA: Diagnosis not present

## 2019-05-08 DIAGNOSIS — J90 Pleural effusion, not elsewhere classified: Secondary | ICD-10-CM | POA: Diagnosis not present

## 2019-05-08 LAB — BASIC METABOLIC PANEL
Anion gap: 9 (ref 5–15)
BUN: 45 mg/dL — ABNORMAL HIGH (ref 8–23)
CO2: 40 mmol/L — ABNORMAL HIGH (ref 22–32)
Calcium: 9.1 mg/dL (ref 8.9–10.3)
Chloride: 89 mmol/L — ABNORMAL LOW (ref 98–111)
Creatinine, Ser: 0.57 mg/dL (ref 0.44–1.00)
GFR calc Af Amer: >60 mL/min (ref >60)
GFR calc non Af Amer: >60 mL/min (ref >60)
Glucose, Bld: 129 mg/dL — ABNORMAL HIGH (ref 70–99)
Potassium: 3.4 mmol/L — ABNORMAL LOW (ref 3.5–5.1)
Sodium: 138 mmol/L (ref 135–145)

## 2019-05-08 LAB — CBC
HCT: 26.9 % — ABNORMAL LOW (ref 36.0–46.0)
Hemoglobin: 8.4 g/dL — ABNORMAL LOW (ref 12.0–15.0)
MCH: 27.1 pg (ref 26.0–34.0)
MCHC: 31.2 g/dL (ref 30.0–36.0)
MCV: 86.8 fL (ref 80.0–100.0)
Platelets: 292 10*3/uL (ref 150–400)
RBC: 3.1 MIL/uL — ABNORMAL LOW (ref 3.87–5.11)
RDW: 17.2 % — ABNORMAL HIGH (ref 11.5–15.5)
WBC: 11.1 10*3/uL — ABNORMAL HIGH (ref 4.0–10.5)
nRBC: 0.5 % — ABNORMAL HIGH (ref 0.0–0.2)

## 2019-05-08 LAB — MAGNESIUM: Magnesium: 1.8 mg/dL (ref 1.7–2.4)

## 2019-05-08 NOTE — Progress Notes (Addendum)
Pulmonary Critical Care Medicine [place]   PULMONARY CRITICAL CARE SERVICE  PROGRESS NOTE  Date of Service: 05/08/2019  Judy Roberts  OVF:[id number]  DOB: [birth year]   DOA: 05/01/2019  Referring Physician: Merton Border, MD  HPI: Judy Roberts is a 85 y.o. female seen for follow up of Acute on Chronic Respiratory Failure.  Patient has a 20-hour goal today on pressure support 12/5 with an FiO2 of 28%.  Currently satting well with no distress.  Medications: Reviewed on Rounds  Physical Exam:  Vitals: Pulse 70 respirations 20 BP 95/65 O2 sat on percent temp 97.3  Ventilator Settings pressure support 12/5 FiO2 28%  . General: Comfortable at this time . Eyes: Grossly normal lids, irises & conjunctiva . ENT: grossly tongue is normal . Neck: no obvious mass . Cardiovascular: S1 S2 normal no gallop . Respiratory: No rales or rhonchi noted . Abdomen: soft . Skin: no rash seen on limited exam . Musculoskeletal: not rigid . Psychiatric:unable to assess . Neurologic: no seizure no involuntary movements         Lab Data:   Basic Metabolic Panel: Recent Labs  Lab 05/02/19 0135 05/03/19 0546 05/04/19 1001 05/05/19 0701 05/08/19 1001  NA 141 140 142 138 138  K 4.0 3.6 3.3* 3.7 3.4*  CL 96* 94* 95* 91* 89*  CO2 33* 34* 35* 35* 40*  GLUCOSE 178* 181* 166* 138* 129*  BUN 73* 68* 64* 64* 45*  CREATININE 1.02* 0.76 0.73 0.72 0.57  CALCIUM 8.9 9.0 8.7* 8.6* 9.1  MG  --  2.1 2.0 2.0 1.8  PHOS  --  2.3* 2.5 2.3*  --     ABG: Recent Labs  Lab 05/02/19 0110 05/02/19 0455 05/04/19 0555  PHART 7.373 7.533* 7.510*  PCO2ART 63.1* 42.4 50.5*  PO2ART 103 82.9* 130*  HCO3 35.8* 35.5* 40.1*  O2SAT 98.2 98.2 99.3    Liver Function Tests: Recent Labs  Lab 05/02/19 0135 05/03/19 0546 05/04/19 1001 05/05/19 0701  AST 22  --   --   --   ALT 23  --   --   --   ALKPHOS 86  --   --   --   BILITOT 0.4  --   --   --   PROT 5.5*  --   --   --   ALBUMIN 2.3*  2.2* 1.9* 1.9*   No results for input(s): LIPASE, AMYLASE in the last 168 hours. No results for input(s): AMMONIA in the last 168 hours.  CBC: Recent Labs  Lab 05/02/19 0135 05/03/19 0546 05/04/19 1001 05/05/19 0701 05/08/19 1001  WBC 9.1 11.2* 12.0* 12.2* 11.1*  HGB 7.7* 8.7* 7.9* 7.8* 8.4*  HCT 26.0* 28.1* 25.6* 25.5* 26.9*  MCV 93.2 89.8 89.5 89.5 86.8  PLT 313 301 258 258 292    Cardiac Enzymes: Recent Labs  Lab 05/02/19 0135  CKTOTAL 92  CKMB 4.1    BNP (last 3 results) No results for input(s): BNP in the last 8760 hours.  ProBNP (last 3 results) No results for input(s): PROBNP in the last 8760 hours.  Radiological Exams: Dg Abd 1 View  Result Date: 05/08/2019 CLINICAL DATA:  Nasogastric tube placement EXAM: ABDOMEN - 1 VIEW COMPARISON:  04/29/2019 FINDINGS: There is a feeding tube with tip at the gastric fundus. The wire is still cannulated. Stool throughout the colon. Normal bowel gas pattern. Lung bases are clear. Levoscoliosis. IMPRESSION: Feeding tube tip at the gastric fundus. Electronically Signed   By:  Monte Fantasia M.D.   On: 05/08/2019 10:20    Assessment/Plan Active Problems:   Acute on chronic respiratory failure with hypoxia (HCC)   Acute on chronic systolic heart failure (HCC)   Supraventricular tachycardia (HCC)   Lobar pneumonia, unspecified organism (HCC)   Pleural effusion   1. Acute on chronic respiratory failure with hypoxia we will continue with full support on pressure support. Patient's FiO2 requirements are still 28% good saturations try to wean FiO2 down if possible.Patient has a 20-hour Goal on pressure support at this time. Currently doing well. Continue supportive measures. 2. Acute on chronic systolic heart failure diuretics as tolerated. 3. Supraventricular tachycardia right now rate controlled 4. Lobar pneumonia unchanged we will continue with present management 5. Pleural effusion as noted above on the chest film has  small effusion on the left and moderate on the rightside patient's the next day or so ranges just   I have personally seen and evaluated the patient, evaluated laboratory and imaging results, formulated the assessment and plan and placed orders. The Patient requires high complexity decision making for assessment and support.  Case was discussed on Rounds with the Respiratory Therapy Staff  Allyne Gee, MD [facility] Pulmonary Critical Care Medicine Sleep Medicine

## 2019-05-09 DIAGNOSIS — J90 Pleural effusion, not elsewhere classified: Secondary | ICD-10-CM | POA: Diagnosis not present

## 2019-05-09 DIAGNOSIS — I5023 Acute on chronic systolic (congestive) heart failure: Secondary | ICD-10-CM | POA: Diagnosis not present

## 2019-05-09 DIAGNOSIS — J9621 Acute and chronic respiratory failure with hypoxia: Secondary | ICD-10-CM | POA: Diagnosis not present

## 2019-05-09 DIAGNOSIS — J181 Lobar pneumonia, unspecified organism: Secondary | ICD-10-CM | POA: Diagnosis not present

## 2019-05-09 LAB — POTASSIUM: Potassium: 4.3 mmol/L (ref 3.5–5.1)

## 2019-05-09 NOTE — Progress Notes (Addendum)
Pulmonary Critical Care Medicine [place]   PULMONARY CRITICAL CARE SERVICE  PROGRESS NOTE  Date of Service: 05/09/2019  ALEZA PEW  PZW:[id number]  DOB: 1929-08-06   DOA: 04/06/2019  Referring Physician: Merton Border, MD  HPI: Judy Roberts is a 85 y.o. female seen for follow up of Acute on Chronic Respiratory Failure.  Patient is a 24-hour goal on pressure support 12/5 with an FiO2 of 28%.  Doing well at this time satting 100% no distress noted.  Medications: Reviewed on Rounds  Physical Exam:  Vitals: Pulse 77 respirations 18 BP 104/58 O2 sat 100% temp 99.9  Ventilator Settings pressure support 12/5 FiO2 of 28%  . General: Comfortable at this time . Eyes: Grossly normal lids, irises & conjunctiva . ENT: grossly tongue is normal . Neck: no obvious mass . Cardiovascular: S1 S2 normal no gallop . Respiratory: No rales or rhonchi noted . Abdomen: soft . Skin: no rash seen on limited exam . Musculoskeletal: not rigid . Psychiatric:unable to assess . Neurologic: no seizure no involuntary movements         Lab Data:   Basic Metabolic Panel: Recent Labs  Lab 05/03/19 0546 05/04/19 1001 05/05/19 0701 05/08/19 1001 05/09/19 0514  NA 140 142 138 138  --   K 3.6 3.3* 3.7 3.4* 4.3  CL 94* 95* 91* 89*  --   CO2 34* 35* 35* 40*  --   GLUCOSE 181* 166* 138* 129*  --   BUN 68* 64* 64* 45*  --   CREATININE 0.76 0.73 0.72 0.57  --   CALCIUM 9.0 8.7* 8.6* 9.1  --   MG 2.1 2.0 2.0 1.8  --   PHOS 2.3* 2.5 2.3*  --   --     ABG: Recent Labs  Lab 05/04/19 0555  PHART 7.510*  PCO2ART 50.5*  PO2ART 130*  HCO3 40.1*  O2SAT 99.3    Liver Function Tests: Recent Labs  Lab 05/03/19 0546 05/04/19 1001 05/05/19 0701  ALBUMIN 2.2* 1.9* 1.9*   No results for input(s): LIPASE, AMYLASE in the last 168 hours. No results for input(s): AMMONIA in the last 168 hours.  CBC: Recent Labs  Lab 05/03/19 0546 05/04/19 1001 05/05/19 0701 05/08/19 1001   WBC 11.2* 12.0* 12.2* 11.1*  HGB 8.7* 7.9* 7.8* 8.4*  HCT 28.1* 25.6* 25.5* 26.9*  MCV 89.8 89.5 89.5 86.8  PLT 301 258 258 292    Cardiac Enzymes: No results for input(s): CKTOTAL, CKMB, CKMBINDEX, TROPONINI in the last 168 hours.  BNP (last 3 results) No results for input(s): BNP in the last 8760 hours.  ProBNP (last 3 results) No results for input(s): PROBNP in the last 8760 hours.  Radiological Exams: Dg Abd 1 View  Result Date: 05/08/2019 CLINICAL DATA:  Nasogastric tube placement EXAM: ABDOMEN - 1 VIEW COMPARISON:  04/28/2019 FINDINGS: There is a feeding tube with tip at the gastric fundus. The wire is still cannulated. Stool throughout the colon. Normal bowel gas pattern. Lung bases are clear. Levoscoliosis. IMPRESSION: Feeding tube tip at the gastric fundus. Electronically Signed   By: Monte Fantasia M.D.   On: 05/08/2019 10:20    Assessment/Plan Active Problems:   Acute on chronic respiratory failure with hypoxia (HCC)   Acute on chronic systolic heart failure (HCC)   Supraventricular tachycardia (HCC)   Lobar pneumonia, unspecified organism (HCC)   Pleural effusion   1. Acute on chronic respiratory failure with hypoxia we will continue with pressure support trial with  a goal of 24 hours today.  Continue to titrate per protocol as patient can tolerate.  Continue pulmonary toilet and supportive measures. 2. Acute on chronic systolic heart failure diuretics as tolerated. 3. Supraventricular tachycardia right now rate controlled 4. Lobar pneumonia unchanged we will continue with present management 5. Pleural effusion bilaterally, continue to monitor radiologically.   I have personally seen and evaluated the patient, evaluated laboratory and imaging results, formulated the assessment and plan and placed orders. The Patient requires high complexity decision making for assessment and support.  Case was discussed on Rounds with the Respiratory Therapy Staff  Allyne Gee, MD [facility] Pulmonary Critical Care Medicine Sleep Medicine

## 2019-05-10 DIAGNOSIS — J9621 Acute and chronic respiratory failure with hypoxia: Secondary | ICD-10-CM | POA: Diagnosis not present

## 2019-05-10 DIAGNOSIS — I5023 Acute on chronic systolic (congestive) heart failure: Secondary | ICD-10-CM | POA: Diagnosis not present

## 2019-05-10 DIAGNOSIS — J90 Pleural effusion, not elsewhere classified: Secondary | ICD-10-CM | POA: Diagnosis not present

## 2019-05-10 DIAGNOSIS — J181 Lobar pneumonia, unspecified organism: Secondary | ICD-10-CM | POA: Diagnosis not present

## 2019-05-10 LAB — VANCOMYCIN, TROUGH: Vancomycin Tr: 19 ug/mL (ref 15–20)

## 2019-05-10 NOTE — Progress Notes (Addendum)
Pulmonary Critical Care Medicine [place]   PULMONARY CRITICAL CARE SERVICE  PROGRESS NOTE  Date of Service: 05/10/2019  ZYNIA WOJTOWICZ  TDV:[id number]  DOB: 1929-08-03   DOA: 04/10/2019  Referring Physician: Merton Border, MD  HPI: Judy Roberts is a 85 y.o. female seen for follow up of Acute on Chronic Respiratory Failure.  Patient remains on pressure for 12/5 with an FiO2 of 28%.  Satting well with no distress at time.  Medications: Reviewed on Rounds  Physical Exam:  Vitals: Pulse 73 respirations 25 BP 105/60 O2 sat 100% temp 97.6  Ventilator Settings pressure support 12/5 FiO2 28%  . General: Comfortable at this time . Eyes: Grossly normal lids, irises & conjunctiva . ENT: grossly tongue is normal . Neck: no obvious mass . Cardiovascular: S1 S2 normal no gallop . Respiratory: No rales or rhonchi noted . Abdomen: soft . Skin: no rash seen on limited exam . Musculoskeletal: not rigid . Psychiatric:unable to assess . Neurologic: no seizure no involuntary movements         Lab Data:   Basic Metabolic Panel: Recent Labs  Lab 05/04/19 1001 05/05/19 0701 05/08/19 1001 05/09/19 0514  NA 142 138 138  --   K 3.3* 3.7 3.4* 4.3  CL 95* 91* 89*  --   CO2 35* 35* 40*  --   GLUCOSE 166* 138* 129*  --   BUN 64* 64* 45*  --   CREATININE 0.73 0.72 0.57  --   CALCIUM 8.7* 8.6* 9.1  --   MG 2.0 2.0 1.8  --   PHOS 2.5 2.3*  --   --     ABG: Recent Labs  Lab 05/04/19 0555  PHART 7.510*  PCO2ART 50.5*  PO2ART 130*  HCO3 40.1*  O2SAT 99.3    Liver Function Tests: Recent Labs  Lab 05/04/19 1001 05/05/19 0701  ALBUMIN 1.9* 1.9*   No results for input(s): LIPASE, AMYLASE in the last 168 hours. No results for input(s): AMMONIA in the last 168 hours.  CBC: Recent Labs  Lab 05/04/19 1001 05/05/19 0701 05/08/19 1001  WBC 12.0* 12.2* 11.1*  HGB 7.9* 7.8* 8.4*  HCT 25.6* 25.5* 26.9*  MCV 89.5 89.5 86.8  PLT 258 258 292    Cardiac  Enzymes: No results for input(s): CKTOTAL, CKMB, CKMBINDEX, TROPONINI in the last 168 hours.  BNP (last 3 results) No results for input(s): BNP in the last 8760 hours.  ProBNP (last 3 results) No results for input(s): PROBNP in the last 8760 hours.  Radiological Exams: No results found.  Assessment/Plan Active Problems:   Acute on chronic respiratory failure with hypoxia (HCC)   Acute on chronic systolic heart failure (HCC)   Supraventricular tachycardia (HCC)   Lobar pneumonia, unspecified organism (HCC)   Pleural effusion   1. Acute on chronic respiratory failure with hypoxia we will continue with pressure support trial.  Continue to titrate per protocol as patient can tolerate.  Continue pulmonary toilet and supportive measures. 2. Acute on chronic systolic heart failure diuretics as tolerated. 3. Supraventricular tachycardia right now rate controlled 4. Lobar pneumonia unchanged we will continue with present management 1. Pleural effusion bilaterally, continue to monitor radiologically.   I have personally seen and evaluated the patient, evaluated laboratory and imaging results, formulated the assessment and plan and placed orders. The Patient requires high complexity decision making for assessment and support.  Case was discussed on Rounds with the Respiratory Therapy Staff  Allyne Gee, MD [facility]  Pulmonary Critical Care Medicine Sleep Medicine

## 2019-05-11 DIAGNOSIS — J181 Lobar pneumonia, unspecified organism: Secondary | ICD-10-CM | POA: Diagnosis not present

## 2019-05-11 DIAGNOSIS — I5023 Acute on chronic systolic (congestive) heart failure: Secondary | ICD-10-CM | POA: Diagnosis not present

## 2019-05-11 DIAGNOSIS — J9621 Acute and chronic respiratory failure with hypoxia: Secondary | ICD-10-CM | POA: Diagnosis not present

## 2019-05-11 DIAGNOSIS — J90 Pleural effusion, not elsewhere classified: Secondary | ICD-10-CM | POA: Diagnosis not present

## 2019-05-11 LAB — CBC
HCT: 25.8 % — ABNORMAL LOW (ref 36.0–46.0)
Hemoglobin: 7.9 g/dL — ABNORMAL LOW (ref 12.0–15.0)
MCH: 26.6 pg (ref 26.0–34.0)
MCHC: 30.6 g/dL (ref 30.0–36.0)
MCV: 86.9 fL (ref 80.0–100.0)
Platelets: 327 10*3/uL (ref 150–400)
RBC: 2.97 MIL/uL — ABNORMAL LOW (ref 3.87–5.11)
RDW: 17.2 % — ABNORMAL HIGH (ref 11.5–15.5)
WBC: 9.1 10*3/uL (ref 4.0–10.5)
nRBC: 0.2 % (ref 0.0–0.2)

## 2019-05-11 LAB — BASIC METABOLIC PANEL
Anion gap: 11 (ref 5–15)
BUN: 40 mg/dL — ABNORMAL HIGH (ref 8–23)
CO2: 38 mmol/L — ABNORMAL HIGH (ref 22–32)
Calcium: 9.1 mg/dL (ref 8.9–10.3)
Chloride: 87 mmol/L — ABNORMAL LOW (ref 98–111)
Creatinine, Ser: 0.56 mg/dL (ref 0.44–1.00)
GFR calc Af Amer: >60 mL/min (ref >60)
GFR calc non Af Amer: >60 mL/min (ref >60)
Glucose, Bld: 179 mg/dL — ABNORMAL HIGH (ref 70–99)
Potassium: 3.4 mmol/L — ABNORMAL LOW (ref 3.5–5.1)
Sodium: 136 mmol/L (ref 135–145)

## 2019-05-11 LAB — MAGNESIUM: Magnesium: 1.8 mg/dL (ref 1.7–2.4)

## 2019-05-11 NOTE — Progress Notes (Addendum)
Pulmonary Critical Care Medicine [place]   PULMONARY CRITICAL CARE SERVICE  PROGRESS NOTE  Date of Service: 05/11/2019  JADY BRAGGS  WSF:[id number]  DOB: [birth year]    DOA: 05/04/2019  Referring Physician: Merton Border, MD  HPI: JAQULYN CHANCELLOR is a 85 y.o. female seen for follow up of Acute on Chronic Respiratory Failure.  Patient remains on pressure support 12/5 FiO2 28% satting well with no distress.  Medications: Reviewed on Rounds  Physical Exam:  Vitals: Pulse 80 respirations 22 BP 104/55 O2 sat 99% temp 98.9  Ventilator Settings pressure support 12/5 FiO2 of 28%  . General: Comfortable at this time . Eyes: Grossly normal lids, irises & conjunctiva . ENT: grossly tongue is normal . Neck: no obvious mass . Cardiovascular: S1 S2 normal no gallop . Respiratory: No rales or rhonchi noted . Abdomen: soft . Skin: no rash seen on limited exam . Musculoskeletal: not rigid . Psychiatric:unable to assess . Neurologic: no seizure no involuntary movements         Lab Data:   Basic Metabolic Panel: Recent Labs  Lab 05/05/19 0701 05/08/19 1001 05/09/19 0514 05/11/19 0604  NA 138 138  --  136  K 3.7 3.4* 4.3 3.4*  CL 91* 89*  --  87*  CO2 35* 40*  --  38*  GLUCOSE 138* 129*  --  179*  BUN 64* 45*  --  40*  CREATININE 0.72 0.57  --  0.56  CALCIUM 8.6* 9.1  --  9.1  MG 2.0 1.8  --  1.8  PHOS 2.3*  --   --   --     ABG: No results for input(s): PHART, PCO2ART, PO2ART, HCO3, O2SAT in the last 168 hours.  Liver Function Tests: Recent Labs  Lab 05/05/19 0701  ALBUMIN 1.9*   No results for input(s): LIPASE, AMYLASE in the last 168 hours. No results for input(s): AMMONIA in the last 168 hours.  CBC: Recent Labs  Lab 05/05/19 0701 05/08/19 1001 05/11/19 0604  WBC 12.2* 11.1* 9.1  HGB 7.8* 8.4* 7.9*  HCT 25.5* 26.9* 25.8*  MCV 89.5 86.8 86.9  PLT 258 292 327    Cardiac Enzymes: No results for input(s): CKTOTAL, CKMB, CKMBINDEX,  TROPONINI in the last 168 hours.  BNP (last 3 results) No results for input(s): BNP in the last 8760 hours.  ProBNP (last 3 results) No results for input(s): PROBNP in the last 8760 hours.  Radiological Exams: No results found.  Assessment/Plan Active Problems:   Acute on chronic respiratory failure with hypoxia (HCC)   Acute on chronic systolic heart failure (HCC)   Supraventricular tachycardia (HCC)   Lobar pneumonia, unspecified organism (HCC)   Pleural effusion   1. Acute on chronic respiratory failure with hypoxia we will continuewith pressure support trial. Continue to titrate per protocol as patient can tolerate. Continue pulmonary toilet and supportive measures. 2. Acute on chronic systolic heart failure diuretics as tolerated. 3. Supraventricular tachycardia right now rate controlled 4. Lobar pneumonia unchanged we will continue with present management 1. Pleural effusionbilaterally, continue to monitor radiologically.   I have personally seen and evaluated the patient, evaluated laboratory and imaging results, formulated the assessment and plan and placed orders. The Patient requires high complexity decision making for assessment and support.  Case was discussed on Rounds with the Respiratory Therapy Staff  Allyne Gee, MD [facility] Pulmonary Critical Care Medicine Sleep Medicine

## 2019-05-12 DIAGNOSIS — J9621 Acute and chronic respiratory failure with hypoxia: Secondary | ICD-10-CM | POA: Diagnosis not present

## 2019-05-12 DIAGNOSIS — J181 Lobar pneumonia, unspecified organism: Secondary | ICD-10-CM | POA: Diagnosis not present

## 2019-05-12 DIAGNOSIS — J90 Pleural effusion, not elsewhere classified: Secondary | ICD-10-CM | POA: Diagnosis not present

## 2019-05-12 DIAGNOSIS — I5023 Acute on chronic systolic (congestive) heart failure: Secondary | ICD-10-CM | POA: Diagnosis not present

## 2019-05-12 LAB — POTASSIUM: Potassium: 3.7 mmol/L (ref 3.5–5.1)

## 2019-05-12 NOTE — Progress Notes (Signed)
Pulmonary Critical Care Medicine [place]   PULMONARY CRITICAL CARE SERVICE  PROGRESS NOTE  Date of Service: 05/12/2019  Judy Roberts  LKG:[id number]  DOB: 1929-03-07   DOA: 04/24/2019  Referring Physician: Merton Border, MD  HPI: Judy Roberts is a 85 y.o. female seen for follow up of Acute on Chronic Respiratory Failure.  Patient is on pressure support now for several days doing very well.  Patient is supposed to have the neck collar on for the next 6 weeks apparently she is able to extubate today  Medications: Reviewed on Rounds  Physical Exam:  Vitals: Temperature 98.9 pulse 70 respiratory rate 17 blood pressure 116/67 saturations 98%  Ventilator Settings mode ventilation pressure support FiO2 28% pressure 12 PEEP 5 tidal volume 320  . General: Comfortable at this time . Eyes: Grossly normal lids, irises & conjunctiva . ENT: grossly tongue is normal . Neck: no obvious mass . Cardiovascular: S1 S2 normal no gallop . Respiratory: No rhonchi or rales are noted at this time . Abdomen: soft . Skin: no rash seen on limited exam . Musculoskeletal: not rigid . Psychiatric:unable to assess . Neurologic: no seizure no involuntary movements         Lab Data:   Basic Metabolic Panel: Recent Labs  Lab 05/08/19 1001 05/09/19 0514 05/11/19 0604  NA 138  --  136  K 3.4* 4.3 3.4*  CL 89*  --  87*  CO2 40*  --  38*  GLUCOSE 129*  --  179*  BUN 45*  --  40*  CREATININE 0.57  --  0.56  CALCIUM 9.1  --  9.1  MG 1.8  --  1.8    ABG: No results for input(s): PHART, PCO2ART, PO2ART, HCO3, O2SAT in the last 168 hours.  Liver Function Tests: No results for input(s): AST, ALT, ALKPHOS, BILITOT, PROT, ALBUMIN in the last 168 hours. No results for input(s): LIPASE, AMYLASE in the last 168 hours. No results for input(s): AMMONIA in the last 168 hours.  CBC: Recent Labs  Lab 05/08/19 1001 05/11/19 0604  WBC 11.1* 9.1  HGB 8.4* 7.9*  HCT 26.9* 25.8*  MCV  86.8 86.9  PLT 292 327    Cardiac Enzymes: No results for input(s): CKTOTAL, CKMB, CKMBINDEX, TROPONINI in the last 168 hours.  BNP (last 3 results) No results for input(s): BNP in the last 8760 hours.  ProBNP (last 3 results) No results for input(s): PROBNP in the last 8760 hours.  Radiological Exams: No results found.  Assessment/Plan Active Problems:   Acute on chronic respiratory failure with hypoxia (HCC)   Acute on chronic systolic heart failure (HCC)   Supraventricular tachycardia (HCC)   Lobar pneumonia, unspecified organism (HCC)   Pleural effusion   1. Acute on chronic respiratory failure with hypoxia we will continue with advancing the wean today and extubate as discussed during rounds with respiratory therapy. 2. Acute on chronic systolic heart failure at baseline we will continue with supportive care 3. Supraventricular tachycardia no runs noted 4. Lobar pneumonia clinically improved 5. Pleural effusion follow radiologically   I have personally seen and evaluated the patient, evaluated laboratory and imaging results, formulated the assessment and plan and placed orders. The Patient requires high complexity decision making for assessment and support.  Case was discussed on Rounds with the Respiratory Therapy Staff  Allyne Gee, MD [facility] Pulmonary Critical Care Medicine Sleep Medicine

## 2019-05-13 DIAGNOSIS — J90 Pleural effusion, not elsewhere classified: Secondary | ICD-10-CM | POA: Diagnosis not present

## 2019-05-13 DIAGNOSIS — J9621 Acute and chronic respiratory failure with hypoxia: Secondary | ICD-10-CM | POA: Diagnosis not present

## 2019-05-13 DIAGNOSIS — I5023 Acute on chronic systolic (congestive) heart failure: Secondary | ICD-10-CM | POA: Diagnosis not present

## 2019-05-13 DIAGNOSIS — J181 Lobar pneumonia, unspecified organism: Secondary | ICD-10-CM | POA: Diagnosis not present

## 2019-05-13 NOTE — Progress Notes (Signed)
Pulmonary Critical Care Medicine [place]   PULMONARY CRITICAL CARE SERVICE  PROGRESS NOTE  Date of Service: 05/13/2019  Blanchard PAONE  FXT:[id number]  DOB: 1929-08-22   DOA: 05/01/2019  Referring Physician: Merton Border, MD  HPI: Judy Roberts is a 85 y.o. female seen for follow up of Acute on Chronic Respiratory Failure.  Decannulated yesterday currently is on 2 L.  Secretions are fair to moderate.  She has a weak cough so is going to need close monitoring  Medications: Reviewed on Rounds  Physical Exam:  Vitals: Temperature 96.9 pulse 75 respiratory rate 22 blood pressure 132/73 saturations 99%  Ventilator Settings off the ventilator on 2 L  . General: Comfortable at this time . Eyes: Grossly normal lids, irises & conjunctiva . ENT: grossly tongue is normal . Neck: no obvious mass . Cardiovascular: S1 S2 normal no gallop . Respiratory: No rhonchi or rales are noted at this time . Abdomen: soft . Skin: no rash seen on limited exam . Musculoskeletal: not rigid . Psychiatric:unable to assess . Neurologic: no seizure no involuntary movements         Lab Data:   Basic Metabolic Panel: Recent Labs  Lab 05/08/19 1001 05/09/19 0514 05/11/19 0604 05/12/19 0857  NA 138  --  136  --   K 3.4* 4.3 3.4* 3.7  CL 89*  --  87*  --   CO2 40*  --  38*  --   GLUCOSE 129*  --  179*  --   BUN 45*  --  40*  --   CREATININE 0.57  --  0.56  --   CALCIUM 9.1  --  9.1  --   MG 1.8  --  1.8  --     ABG: No results for input(s): PHART, PCO2ART, PO2ART, HCO3, O2SAT in the last 168 hours.  Liver Function Tests: No results for input(s): AST, ALT, ALKPHOS, BILITOT, PROT, ALBUMIN in the last 168 hours. No results for input(s): LIPASE, AMYLASE in the last 168 hours. No results for input(s): AMMONIA in the last 168 hours.  CBC: Recent Labs  Lab 05/08/19 1001 05/11/19 0604  WBC 11.1* 9.1  HGB 8.4* 7.9*  HCT 26.9* 25.8*  MCV 86.8 86.9  PLT 292 327     Cardiac Enzymes: No results for input(s): CKTOTAL, CKMB, CKMBINDEX, TROPONINI in the last 168 hours.  BNP (last 3 results) No results for input(s): BNP in the last 8760 hours.  ProBNP (last 3 results) No results for input(s): PROBNP in the last 8760 hours.  Radiological Exams: No results found.  Assessment/Plan Active Problems:   Acute on chronic respiratory failure with hypoxia (HCC)   Acute on chronic systolic heart failure (HCC)   Supraventricular tachycardia (HCC)   Lobar pneumonia, unspecified organism (HCC)   Pleural effusion   1. Acute on chronic respiratory failure with hypoxia we will continue with 2 L oxygen therapy continue secretion management pulmonary toilet. 2. Acute on chronic systolic heart failure monitor fluid status diuretics as needed 3. Supraventricular tachycardia rate is controlled at this time we will continue with supportive care 4. Lobar pneumonia treated we will continue to follow 5. Pleural effusion at baseline we will continue with supportive care   I have personally seen and evaluated the patient, evaluated laboratory and imaging results, formulated the assessment and plan and placed orders. The Patient requires high complexity decision making for assessment and support.  Case was discussed on Rounds with the Respiratory Therapy Staff  Allyne Gee, MD [facility] Pulmonary Critical Care Medicine Sleep Medicine

## 2019-05-14 ENCOUNTER — Encounter (HOSPITAL_COMMUNITY): Payer: Self-pay | Admitting: Certified Registered"

## 2019-05-14 ENCOUNTER — Other Ambulatory Visit (HOSPITAL_COMMUNITY): Payer: Self-pay

## 2019-05-14 ENCOUNTER — Inpatient Hospital Stay: Admission: EM | Admit: 2019-05-14 | Payer: Medicare Other | Source: Ambulatory Visit | Admitting: Pulmonary Disease

## 2019-05-14 MED FILL — Medication: Qty: 2 | Status: AC

## 2019-06-04 NOTE — Code Documentation (Signed)
  Patient Name: Judy Roberts   MRN: [id number]   Date of Birth/ Sex: [birth year] , female      Admission Date: 04/07/2019  Attending Provider: Merton Border, MD  Primary Diagnosis: spinal stenosis   Indication: Pt had been previously coded twice earlier in the night and was planning to move to the ICU once stable when pt experienced a third episode of PEA. Code blue was subsequently called. At the time of arrival on scene, ACLS protocol was underway.  ROSC was achieved for about 5 mintues when pt fell into PEA for a fourth time.   Technical Description:  - CPR performance duration:  6 minutes  - Was defibrillation or cardioversion used? No   - Was external pacer placed? No  - Was patient intubated pre/post CPR? Yes   Medications Administered: Y = Yes; Blank = No Amiodarone    Atropine    Calcium    Epinephrine  Y  Lidocaine    Magnesium    Norepinephrine    Phenylephrine    Sodium bicarbonate  Y  Vasopressin  Y  Other Y  Versed and propofol  Post CPR evaluation:  - Final Status - Was patient successfully resuscitated ? Yes  Miscellaneous Information:     - Primary team notified?  Yes  - Family Notified? Yes     Matilde Haymaker, MD   05/30/19, 6:02 AM

## 2019-06-04 NOTE — Code Documentation (Signed)
  Patient Name: WYNONNA FITZHENRY   MRN: [id number]   Date of Birth/ Sex: 25-Jul-1929 , female      Admission Date: 04/29/2019  Attending Provider: Merton Border, MD  Primary Diagnosis: spinal stenosis   Indication: Pt was in her usual state of health until this PM, when she was noted to be bradycardic. Code blue was subsequently called. At the time of arrival on scene, ACLS protocol was underway. Per report, the event was likely precipitated by aspiration.   Technical Description:  - CPR performance duration:  10 minutes  - Was defibrillation or cardioversion used? No   - Was external pacer placed? No  - Was patient intubated pre/post CPR? Yes   Medications Administered: Y = Yes; Blank = No Amiodarone    Atropine  Y  Calcium    Epinephrine  Y  Lidocaine    Magnesium    Norepinephrine    Phenylephrine    Sodium bicarbonate    Vasopressin    Other    Post CPR evaluation:  - Final Status - Was patient successfully resuscitated ? Yes, pt had a palpable pulse and breath sounds.  Miscellaneous Information:  Pt admitted to [facility]            Matilde Haymaker, MD   2019-05-16, 3:24 AM

## 2019-06-04 NOTE — Code Documentation (Signed)
  Patient Name: Judy Roberts   MRN: [id number]   Date of Birth/ Sex: 1929-09-06 , female      Admission Date: 04/25/2019  Attending Provider: Merton Border, MD  Primary Diagnosis: spinal stenosis   Indication: Pt had previously been coded once earlier in the night.  She experienced bradycardia and again fell into PEA. Code blue was subsequently called. At the time of arrival on scene, ACLS protocol was underway.   Technical Description:  - CPR performance duration:  10 minutes  - Was defibrillation or cardioversion used? No   - Was external pacer placed? No  - Was patient intubated pre/post CPR? Yes   Medications Administered: Y = Yes; Blank = No Amiodarone    Atropine    Calcium    Epinephrine  Y  Lidocaine    Magnesium    Norepinephrine    Phenylephrine    Sodium bicarbonate  Y  Vasopressin    Other    Post CPR evaluation:  - Final Status - Was patient successfully resuscitated ? Yes  Miscellaneous Information:  Plan for pt to move to ICU   - Primary team notified?  Yes  - Family Notified? Yes      Matilde Haymaker, MD   06/07/19, 6:00 AM

## 2019-06-04 NOTE — Code Documentation (Addendum)
  Patient Name: Judy Roberts   MRN: [id number]   Date of Birth/ Sex: [birth year] , female      Admission Date: 04/19/2019  Attending Provider: Merton Border, MD  Primary Diagnosis: spinal stenosis   Indication: Pt had previously been resuscitated from 5 episodes of PEA prior in the night.  The code team was in the room when pt fell in to PEA. Code blue was subsequently called. At the time of arrival on scene, ACLS protocol was underway.   Technical Description:  - CPR performance duration:  6 minutes  - Was defibrillation or cardioversion used? No   - Was external pacer placed? No  - Was patient intubated pre/post CPR? Yes   Medications Administered: Y = Yes; Blank = No Amiodarone    Atropine    Calcium    Epinephrine  Y  Lidocaine    Magnesium    Norepinephrine    Phenylephrine    Sodium bicarbonate    Vasopressin    Other    Post CPR evaluation:  - Final Status - Was patient successfully resuscitated ? No   Miscellaneous Information:  - Time of death:  6:55  AM  - Primary team notified?  Yes  - Family Notified? Yes     Matilde Haymaker, MD   05-26-19, 6:51 AM

## 2019-06-04 NOTE — Progress Notes (Signed)
   2019/05/15 0520  Clinical Encounter Type  Visited With Patient  Visit Type Code  Referral From Nurse  Consult/Referral To Chaplain  This chaplain responded to Pt. Code Blue.  The chaplain was pastorally present and offered spiritual care to the Pt. and medical team.  The chaplain is available for F/U spiritual care as needed.

## 2019-06-04 NOTE — Progress Notes (Signed)
   May 24, 2019 0052  Clinical Encounter Type  Visited With Patient  Visit Type Code  Referral From Nurse  Consult/Referral To Chaplain  This chaplain returned to Pt. Code Blue.  The chaplain was pastorally present and listened to the Pt. niece-Sheila through Face Time.  The chaplain understands the Pt. niece-Sheila is praying for God's intervention and waiting on her sister to arrive.  The niece shared with the chaplain the Pt. is like a mother to her. The chaplain observed the strength and teamwork in the medical team's care for the Pt. This chaplain is available for F/U spiritual care as needed.

## 2019-06-04 NOTE — Anesthesia Procedure Notes (Signed)
Procedure Name: Intubation Date/Time: 06/03/19 2:59 AM Performed by: Babs Bertin, CRNA Pre-anesthesia Checklist: Emergency Drugs available and Suction available Oxygen Delivery Method: Ambu bag Preoxygenation: Pre-oxygenation with 100% oxygen Laryngoscope Size: Glidescope and 3 Grade View: Grade I Tube type: Subglottic suction tube Tube size: 7.5 mm Number of attempts: 1 Airway Equipment and Method: Stylet and Oral airway Placement Confirmation: ETT inserted through vocal cords under direct vision,  positive ETCO2 and breath sounds checked- equal and bilateral Secured at: 21 cm Tube secured with: secured by RT. Dental Injury: Teeth and Oropharynx as per pre-operative assessment

## 2019-06-04 NOTE — Code Documentation (Signed)
  Patient Name: Judy Roberts   MRN: [id number]   Date of Birth/ Sex: Dec 26, 1928 , female      Admission Date: 04/21/2019  Attending Provider: Merton Border, MD  Primary Diagnosis: <principal problem not specified>   Indication: Pt was in her usual state of health until this AM, when she was noted to be asystole. Code blue was subsequently called. At the time of arrival on scene, ACLS protocol was underway.   Technical Description:  - CPR performance duration:  6-8 minutes  - Was defibrillation or cardioversion used? No   - Was external pacer placed? No  - Was patient intubated pre/post CPR? No   Medications Administered: Y = Yes; Blank = No Amiodarone    Atropine    Calcium    Epinephrine  Y  Lidocaine    Magnesium    Norepinephrine    Phenylephrine    Sodium bicarbonate  Y  Vasopressin     Post CPR evaluation:  - Final Status - Was patient successfully resuscitated ? Yes - What is current rhythm? Atrial fibrillation - What is current hemodynamic status? Stable  Miscellaneous Information:  - Labs sent, including: Awaiting results of CMP, CBC, troponin  - Primary team notified?  Yes  - Family Notified? Yes  - Additional notes/ transfer status: Second code today. Both with worsening hypoxia leading to bradycardia then Asystole/PEA arrest. Primary to contact PCCM to discuss transfer.      Ina Homes, MD  28-May-2019, 5:20 AM

## 2019-06-04 NOTE — Progress Notes (Signed)
   May 21, 2019 0315  Clinical Encounter Type  Visited With Patient  Visit Type Initial;Code  Referral From Nurse  Consult/Referral To Chaplain  This chaplain responded to Pt. Code Blue.  The chaplain was pastorally present with the Pt. and medical team.  The chaplain checked with the RN about additional spiritual care needs.  The RN responded no further needs at this time.  The chaplain is available for F/U spiritual care as needed.

## 2019-06-04 DEATH — deceased

## 2019-10-26 IMAGING — DX PORTABLE CHEST - 1 VIEW
2 series · 2 of 2 positions shown · non-contrast
Comparison: Radiographs 04/01/2019 and 12/31/2018.

CLINICAL DATA: Nasogastric tube placement. Respiratory failure.

EXAM:
PORTABLE CHEST 1 VIEW

[chest ap (1 of 2)]
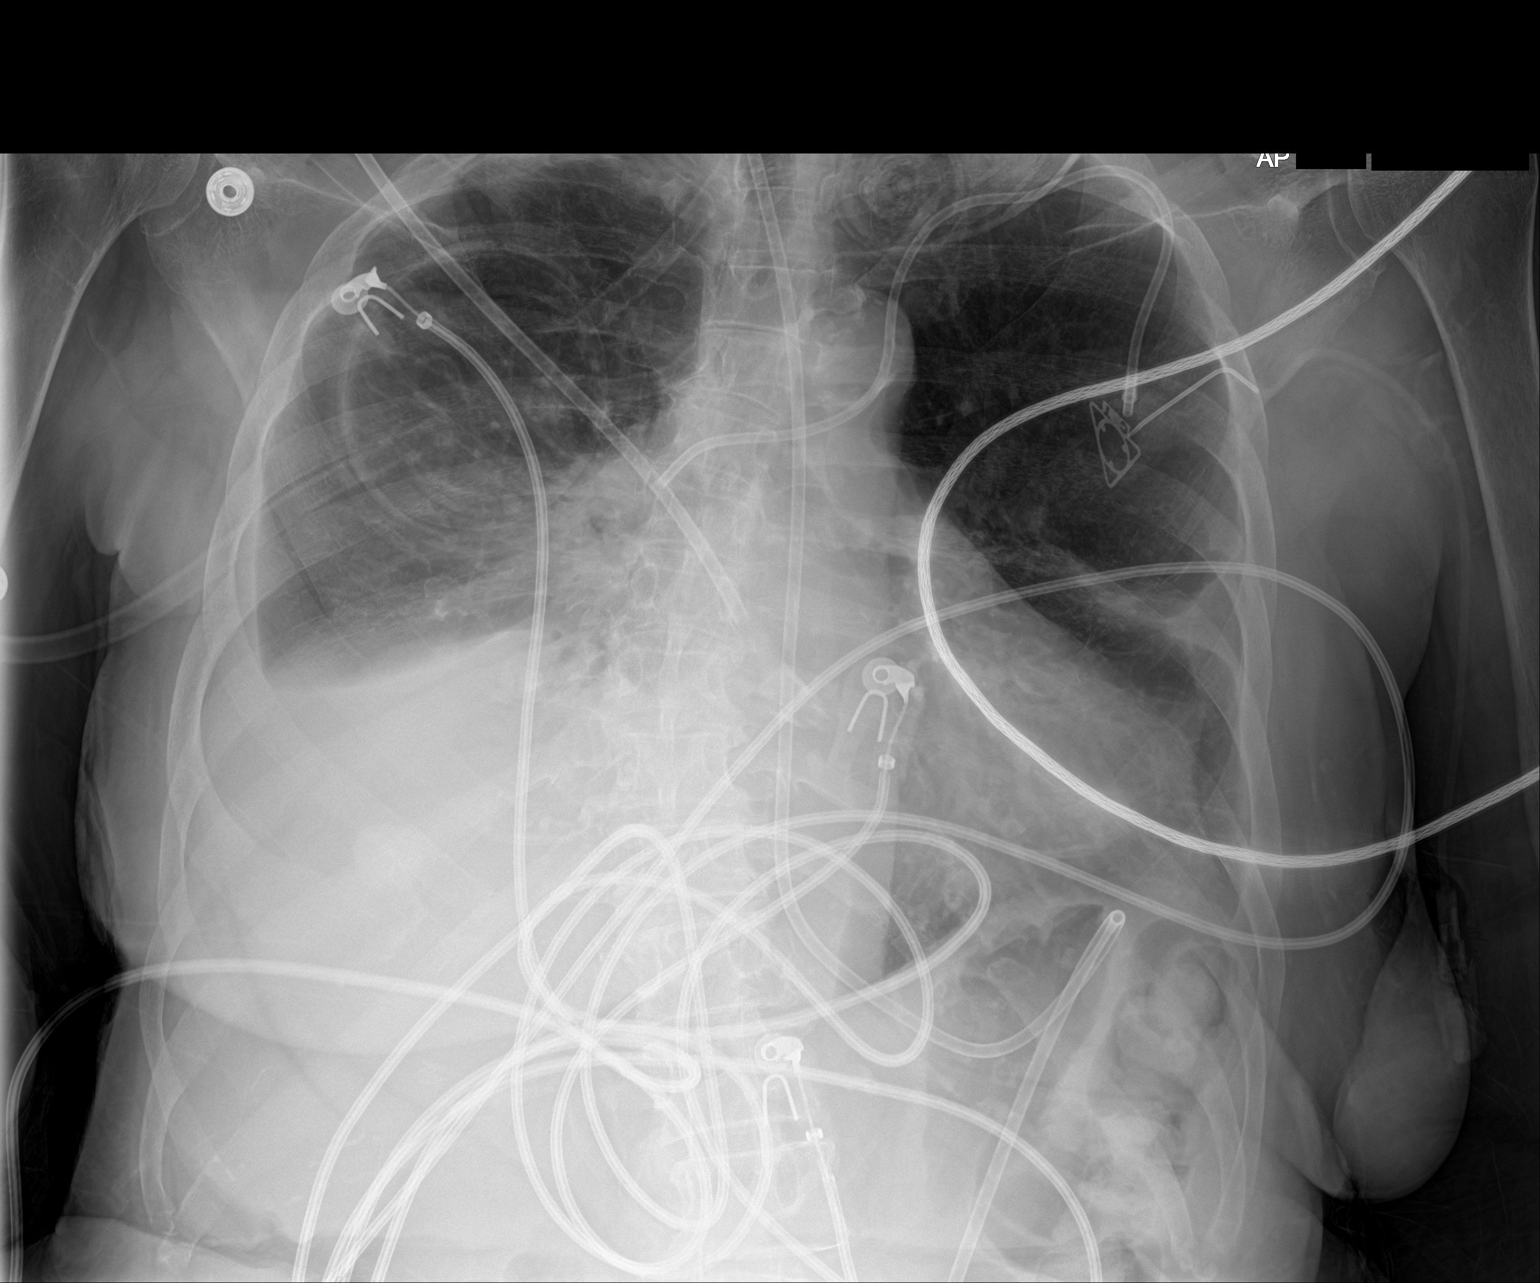

[chest ap (2 of 2)]
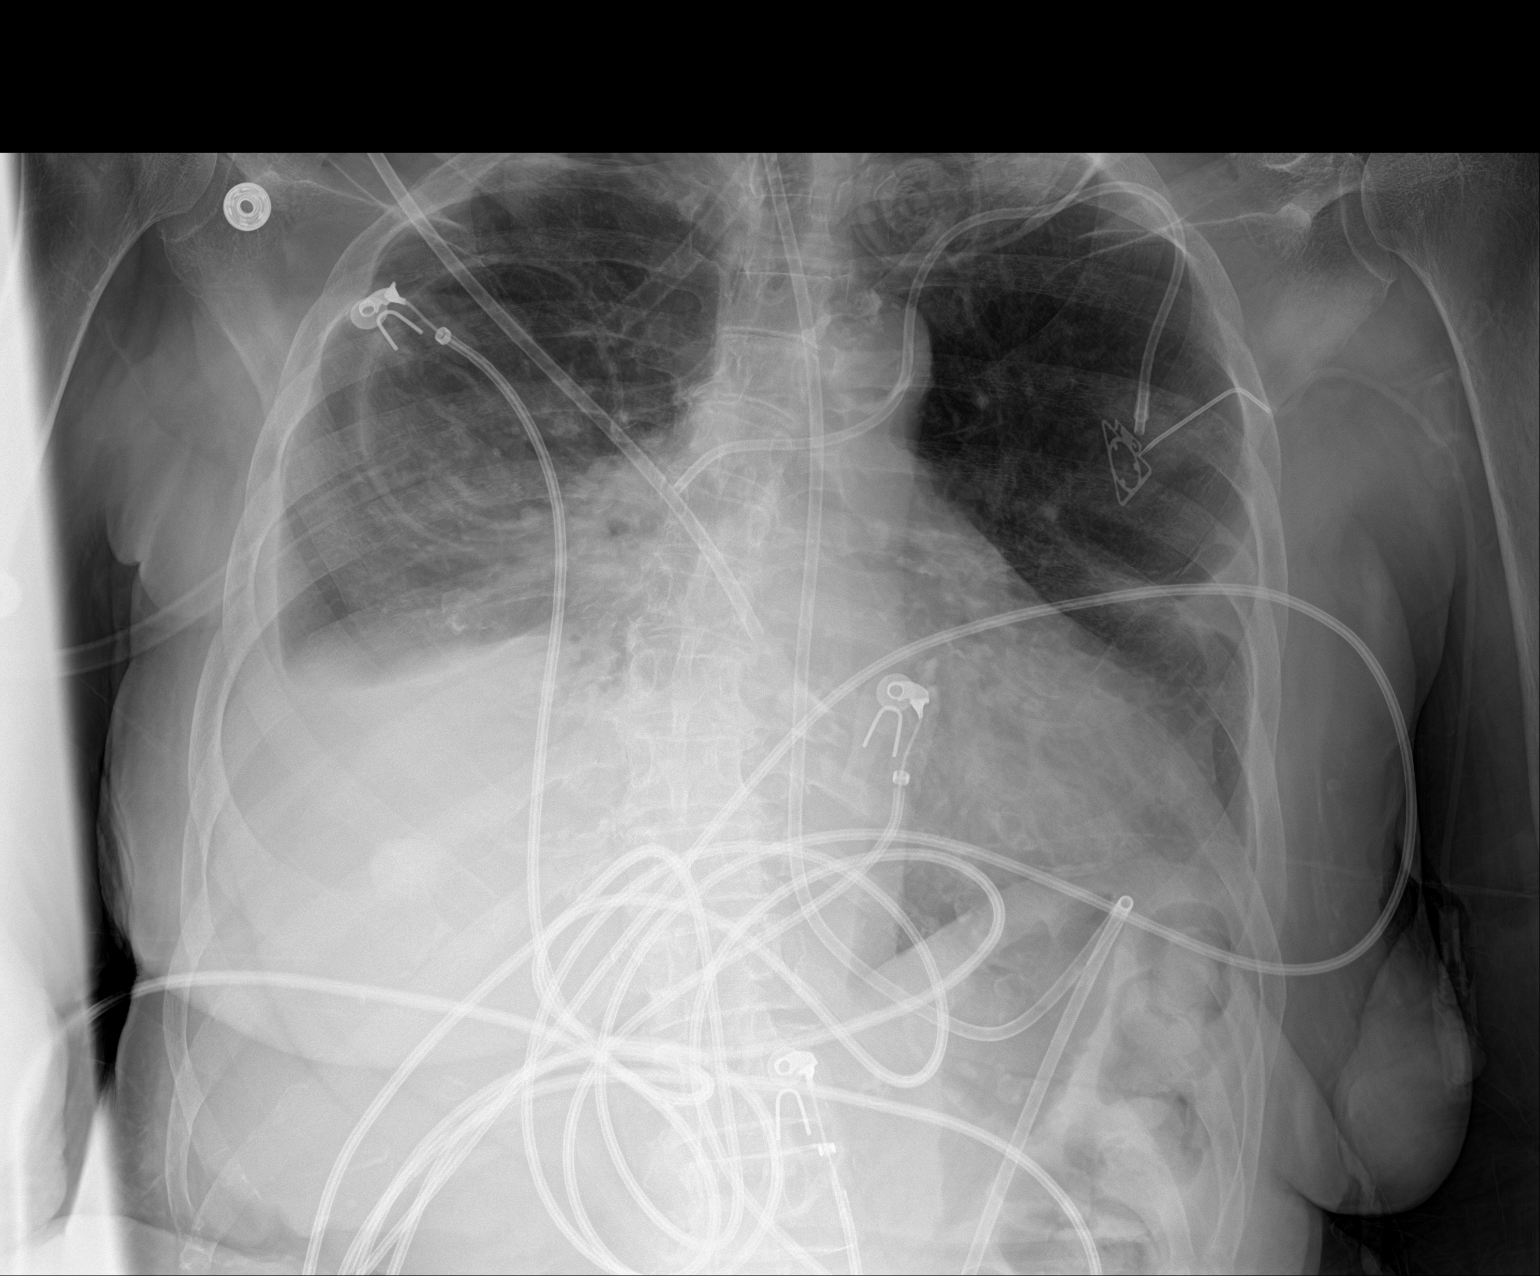

[2 of 2 positions shown; findings below may reference images not displayed]

FINDINGS: 7255 hours. Two views obtained. The left-sided Port-A-Cath is
unchanged at the mid SVC level. A feeding tube has been placed,
projecting below the diaphragm. The heart size and mediastinal
contours are stable. There is a new dependent right pleural effusion
associated with increased opacity at the right lung base. This could
reflect partial collapse or pneumonia. The left lung is clear. There
are degenerative changes within the thoracic spine associated with a
convex right scoliosis. Multiple tubes and lines overlie the
patient.
IMPRESSION: New right pleural effusion and right basilar infiltrate or partial
collapse. Radiographic follow-up recommended to exclude underlying
mass lesion.

Feeding tube projects below the diaphragm, tip visualized on
concurrent abdominal radiograph.

## 2019-10-30 IMAGING — CR PORTABLE CHEST - 1 VIEW
1 series · 1 of 1 positions shown · non-contrast
Comparison: Four days ago

CLINICAL DATA: Shortness of breath and weakness

EXAM:
PORTABLE CHEST 1 VIEW

[AP]
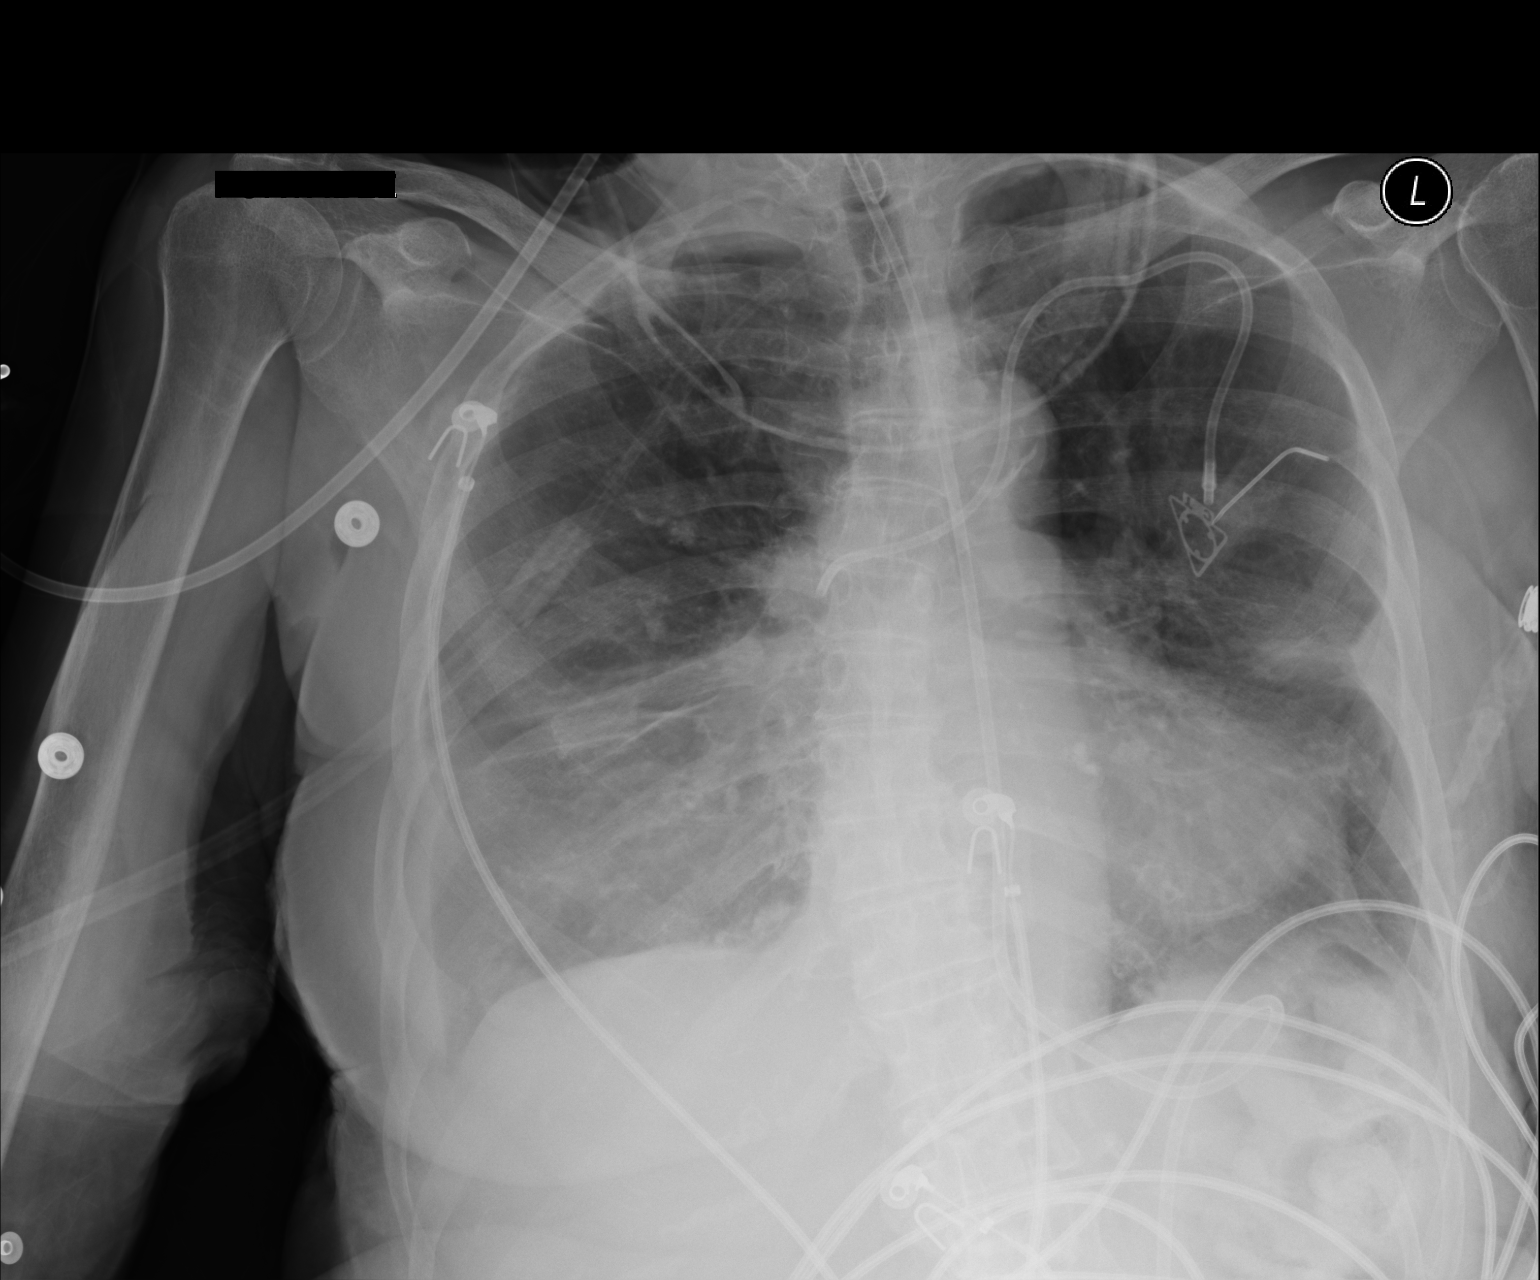

[1 of 1 positions shown; findings below may reference images not displayed]

FINDINGS: Left-sided central line with tip at the SVC or azygos. The feeding
tube at least reaches the stomach. Haziness of the bilateral chest,
greater on the right where there is layering pleural fluid. No
pneumothorax. Normal heart size. Subpleural opacity along the left
mid chest is chronic when compared with 0191 chest CT.
IMPRESSION: Right pleural effusion which is decreased or further layering when
compared to prior.

## 2019-11-01 IMAGING — DX PORTABLE CHEST - 1 VIEW
1 series · 1 of 1 positions shown · non-contrast
Comparison: 04/30/2019

CLINICAL DATA: Status post endotracheal tube placement

EXAM:
PORTABLE CHEST 1 VIEW

[chest ap]
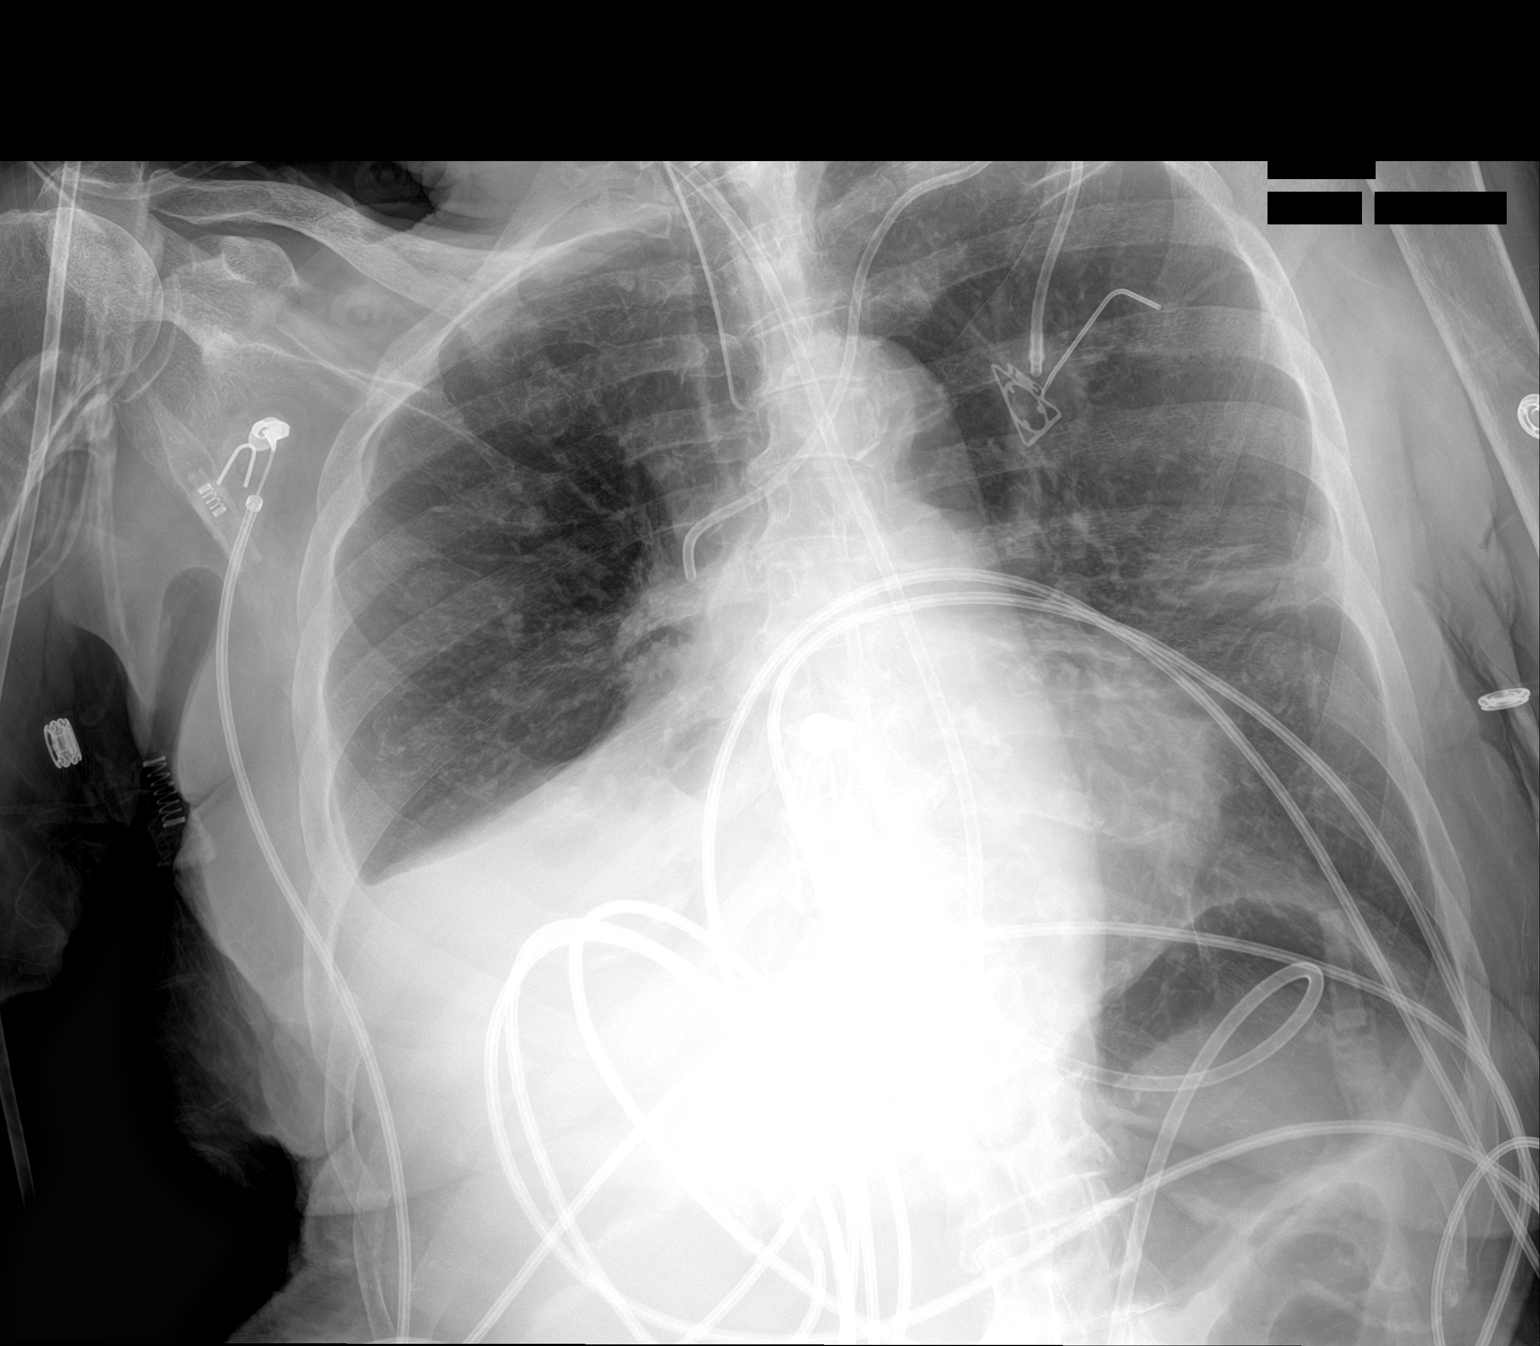

[1 of 1 positions shown; findings below may reference images not displayed]

FINDINGS: None cardiac shadow is stable. Left chest wall port and feeding
catheter are again seen and stable. New endotracheal tube is noted
in satisfactory position. Lungs are well aerated bilaterally with
the exception of right basilar consolidation increased from exam.
Minimal scarring in the left lung is noted.
IMPRESSION: Endotracheal tube in satisfactory position. Increasing consolidation
in the right base.
